# Patient Record
Sex: Male | Born: 1940 | Race: White | Marital: Single
Health system: Southern US, Community
[De-identification: ages and names within clinical notes are randomized; demographics above are authoritative.]

## PROBLEM LIST (undated history)

## (undated) DIAGNOSIS — I2089 Other forms of angina pectoris: Secondary | ICD-10-CM

## (undated) DIAGNOSIS — R0600 Dyspnea, unspecified: Secondary | ICD-10-CM

## (undated) DIAGNOSIS — I4891 Unspecified atrial fibrillation: Secondary | ICD-10-CM

## (undated) DIAGNOSIS — J189 Pneumonia, unspecified organism: Secondary | ICD-10-CM

## (undated) DIAGNOSIS — Z87442 Personal history of urinary calculi: Secondary | ICD-10-CM

## (undated) DIAGNOSIS — I208 Other forms of angina pectoris: Secondary | ICD-10-CM

## (undated) DIAGNOSIS — I2510 Atherosclerotic heart disease of native coronary artery without angina pectoris: Secondary | ICD-10-CM

## (undated) DIAGNOSIS — I219 Acute myocardial infarction, unspecified: Secondary | ICD-10-CM

## (undated) DIAGNOSIS — I499 Cardiac arrhythmia, unspecified: Secondary | ICD-10-CM

## (undated) DIAGNOSIS — N200 Calculus of kidney: Secondary | ICD-10-CM

## (undated) HISTORY — PX: CARDIAC CATHETERIZATION: SHX172

---

## 2018-07-18 ENCOUNTER — Encounter: Payer: Self-pay | Admitting: Neurology

## 2018-07-26 ENCOUNTER — Ambulatory Visit: Payer: Self-pay | Admitting: Neurology

## 2018-09-20 ENCOUNTER — Emergency Department (HOSPITAL_COMMUNITY): Payer: Medicare Other

## 2018-09-20 ENCOUNTER — Inpatient Hospital Stay (HOSPITAL_COMMUNITY)
Admission: EM | Admit: 2018-09-20 | Discharge: 2018-09-23 | DRG: 303 | Disposition: A | Discharge status: Home Health | Payer: Medicare Other | Attending: Internal Medicine | Admitting: Internal Medicine

## 2018-09-20 ENCOUNTER — Other Ambulatory Visit: Payer: Self-pay

## 2018-09-20 ENCOUNTER — Encounter (HOSPITAL_COMMUNITY): Payer: Self-pay

## 2018-09-20 DIAGNOSIS — Z955 Presence of coronary angioplasty implant and graft: Secondary | ICD-10-CM

## 2018-09-20 DIAGNOSIS — R079 Chest pain, unspecified: Secondary | ICD-10-CM | POA: Diagnosis not present

## 2018-09-20 DIAGNOSIS — Z7982 Long term (current) use of aspirin: Secondary | ICD-10-CM

## 2018-09-20 DIAGNOSIS — I25118 Atherosclerotic heart disease of native coronary artery with other forms of angina pectoris: Secondary | ICD-10-CM | POA: Diagnosis not present

## 2018-09-20 DIAGNOSIS — I252 Old myocardial infarction: Secondary | ICD-10-CM

## 2018-09-20 DIAGNOSIS — N139 Obstructive and reflux uropathy, unspecified: Secondary | ICD-10-CM

## 2018-09-20 DIAGNOSIS — N289 Disorder of kidney and ureter, unspecified: Secondary | ICD-10-CM

## 2018-09-20 DIAGNOSIS — N1330 Unspecified hydronephrosis: Secondary | ICD-10-CM | POA: Diagnosis present

## 2018-09-20 DIAGNOSIS — I4891 Unspecified atrial fibrillation: Secondary | ICD-10-CM

## 2018-09-20 DIAGNOSIS — I4510 Unspecified right bundle-branch block: Secondary | ICD-10-CM | POA: Diagnosis present

## 2018-09-20 DIAGNOSIS — Z87891 Personal history of nicotine dependence: Secondary | ICD-10-CM

## 2018-09-20 DIAGNOSIS — Z7902 Long term (current) use of antithrombotics/antiplatelets: Secondary | ICD-10-CM

## 2018-09-20 DIAGNOSIS — N201 Calculus of ureter: Secondary | ICD-10-CM | POA: Diagnosis present

## 2018-09-20 DIAGNOSIS — R109 Unspecified abdominal pain: Secondary | ICD-10-CM

## 2018-09-20 DIAGNOSIS — I482 Chronic atrial fibrillation, unspecified: Secondary | ICD-10-CM | POA: Diagnosis present

## 2018-09-20 DIAGNOSIS — N132 Hydronephrosis with renal and ureteral calculous obstruction: Secondary | ICD-10-CM | POA: Diagnosis present

## 2018-09-20 DIAGNOSIS — Z20828 Contact with and (suspected) exposure to other viral communicable diseases: Secondary | ICD-10-CM | POA: Diagnosis present

## 2018-09-20 DIAGNOSIS — I2510 Atherosclerotic heart disease of native coronary artery without angina pectoris: Secondary | ICD-10-CM | POA: Diagnosis present

## 2018-09-20 DIAGNOSIS — I208 Other forms of angina pectoris: Secondary | ICD-10-CM | POA: Diagnosis present

## 2018-09-20 DIAGNOSIS — I200 Unstable angina: Secondary | ICD-10-CM

## 2018-09-20 HISTORY — DX: Pneumonia, unspecified organism: J18.9

## 2018-09-20 HISTORY — DX: Acute myocardial infarction, unspecified: I21.9

## 2018-09-20 HISTORY — DX: Unspecified atrial fibrillation: I48.91

## 2018-09-20 HISTORY — DX: Atherosclerotic heart disease of native coronary artery without angina pectoris: I25.10

## 2018-09-20 HISTORY — DX: Other forms of angina pectoris: I20.89

## 2018-09-20 HISTORY — DX: Calculus of kidney: N20.0

## 2018-09-20 HISTORY — DX: Other forms of angina pectoris: I20.8

## 2018-09-20 LAB — CBC WITH DIFFERENTIAL/PLATELET
Abs Immature Granulocytes: 0.04 10*3/uL (ref 0.00–0.07)
Basophils Absolute: 0 10*3/uL (ref 0.0–0.1)
Basophils Relative: 0 %
Eosinophils Absolute: 0 10*3/uL (ref 0.0–0.5)
Eosinophils Relative: 0 %
HCT: 43.5 % (ref 39.0–52.0)
Hemoglobin: 13.8 g/dL (ref 13.0–17.0)
Immature Granulocytes: 1 %
Lymphocytes Relative: 8 %
Lymphs Abs: 0.6 10*3/uL — ABNORMAL LOW (ref 0.7–4.0)
MCH: 26.7 pg (ref 26.0–34.0)
MCHC: 31.7 g/dL (ref 30.0–36.0)
MCV: 84.3 fL (ref 80.0–100.0)
Monocytes Absolute: 0.8 10*3/uL (ref 0.1–1.0)
Monocytes Relative: 11 %
Neutro Abs: 5.7 10*3/uL (ref 1.7–7.7)
Neutrophils Relative %: 80 %
Platelets: 155 10*3/uL (ref 150–400)
RBC: 5.16 MIL/uL (ref 4.22–5.81)
RDW: 15.2 % (ref 11.5–15.5)
WBC: 7 10*3/uL (ref 4.0–10.5)
nRBC: 0 % (ref 0.0–0.2)

## 2018-09-20 LAB — COMPREHENSIVE METABOLIC PANEL
ALT: 17 U/L (ref 0–44)
AST: 33 U/L (ref 15–41)
Albumin: 3.4 g/dL — ABNORMAL LOW (ref 3.5–5.0)
Alkaline Phosphatase: 49 U/L (ref 38–126)
Anion gap: 10 (ref 5–15)
BUN: 20 mg/dL (ref 8–23)
CO2: 22 mmol/L (ref 22–32)
Calcium: 9.3 mg/dL (ref 8.9–10.3)
Chloride: 107 mmol/L (ref 98–111)
Creatinine, Ser: 1.7 mg/dL — ABNORMAL HIGH (ref 0.61–1.24)
GFR calc Af Amer: 44 mL/min — ABNORMAL LOW (ref >60)
GFR calc non Af Amer: 38 mL/min — ABNORMAL LOW (ref >60)
Glucose, Bld: 108 mg/dL — ABNORMAL HIGH (ref 70–99)
Potassium: 3.6 mmol/L (ref 3.5–5.1)
Sodium: 139 mmol/L (ref 135–145)
Total Bilirubin: 1.3 mg/dL — ABNORMAL HIGH (ref 0.3–1.2)
Total Protein: 6.4 g/dL — ABNORMAL LOW (ref 6.5–8.1)

## 2018-09-20 LAB — URINALYSIS, ROUTINE W REFLEX MICROSCOPIC
Bacteria, UA: NONE SEEN
Bilirubin Urine: NEGATIVE
Glucose, UA: NEGATIVE mg/dL
Ketones, ur: NEGATIVE mg/dL
Leukocytes,Ua: NEGATIVE
Nitrite: NEGATIVE
Protein, ur: 30 mg/dL — AB
Specific Gravity, Urine: 1.028 (ref 1.005–1.030)
pH: 5 (ref 5.0–8.0)

## 2018-09-20 LAB — TROPONIN I: Troponin I: <0.03 ng/mL (ref <0.03)

## 2018-09-20 LAB — GLUCOSE, CAPILLARY: Glucose-Capillary: 95 mg/dL (ref 70–99)

## 2018-09-20 LAB — SARS CORONAVIRUS 2 BY RT PCR (HOSPITAL ORDER, PERFORMED IN ~~LOC~~ HOSPITAL LAB): SARS Coronavirus 2: NEGATIVE

## 2018-09-20 MED ORDER — NITROGLYCERIN 0.4 MG SL SUBL: 0.4000 mg | SUBLINGUAL_TABLET | SUBLINGUAL | Status: DC | PRN

## 2018-09-20 MED ORDER — MORPHINE SULFATE (PF) 2 MG/ML IV SOLN
1.0000 mg | INTRAVENOUS | Status: DC | PRN
  Filled 2018-09-20: qty 1

## 2018-09-20 MED ORDER — ONDANSETRON HCL 4 MG/2ML IJ SOLN: 4.0000 mg | Freq: Four times a day (QID) | INTRAMUSCULAR | Status: DC | PRN

## 2018-09-20 MED ORDER — MORPHINE SULFATE (PF) 2 MG/ML IV SOLN
2.0000 mg | Freq: Once | INTRAVENOUS | Status: AC
  Administered 2018-09-20: 22:00:00 2 mg via INTRAVENOUS
  Filled 2018-09-20: qty 1

## 2018-09-20 MED ORDER — SODIUM CHLORIDE 0.9 % IV SOLN
INTRAVENOUS | Status: AC
  Administered 2018-09-21: via INTRAVENOUS

## 2018-09-20 MED ORDER — ACETAMINOPHEN 325 MG PO TABS: 650.0000 mg | ORAL_TABLET | ORAL | Status: DC | PRN

## 2018-09-20 MED ORDER — ASPIRIN EC 81 MG PO TBEC
81.0000 mg | DELAYED_RELEASE_TABLET | Freq: Every day | ORAL | Status: DC
  Administered 2018-09-21 – 2018-09-23 (×3): 81 mg via ORAL
  Filled 2018-09-20 (×3): qty 1

## 2018-09-20 NOTE — H&P (Signed)
History and Physical    Mathew Bennett RUE:[id number] DOB: [birth year] DOA: 09/20/2018  PCP: Larene Pickett, FNP   Patient coming from: Home   Chief Complaint: Right flank pain, chest pain   HPI: Mathew Bennett is a 78 y.o. male with medical history significant for coronary artery disease, nephrolithiasis, and atrial fibrillation, now presenting to the emergency department for evaluation of right flank pain and chest pain. Patient reports he been in his usual state of health until approximately 3 days ago when he developed pain in the right flank.  Pain is described as severe at times, radiating towards the right groin, and associated with nausea and some nonbloody vomiting.  Pain had been tolerable at home, but the patient then developed acute onset of central chest pain this evening that prompted his presentation to the ED.  He had been ambulating about his home when he developed acute onset of severe, pressure-like pain localized to the central chest and associated with diaphoresis and nausea.  Patient reports history of MI but does not remember what type of symptoms he was having at that time.  He denies any fevers, chills, hematuria, leg swelling or tenderness, or current shortness of breath.  He knows that he takes Plavix along with some other medications, but does not know the name of these or their indications.  ED Course: Upon arrival to the ED, patient is found to be afebrile, saturating mid 90s on room air, and with remaining vitals also normal.  EKG features atrial fibrillation with PVC and RBBB.  Chest x-ray is negative for acute cardiopulmonary disease.  CT renal stone protocol reveals acute obstructive uropathy on the right with 8 x 9 mm proximal right ureteral calculus and possible forniceal rupture.  Noncontrast head CT is negative for acute intracranial abnormality and cervical spine CT is negative for acute fracture.  CBC is unremarkable.  Chemistry panel is notable for creatinine 1.70.   Troponin is undetectable.  Urinalysis notable for small hemoglobin and 30 protein.  Patient reported a recent mechanical fall, denied any headache or neuro deficit, but underwent CT of the head and cervical spine without any acute intracranial abnormality or acute fracture.  Urology was contacted by the ED physician, reviewed the case, and advised that the patient could follow-up in the clinic if his pain could be controlled in the ED.  ED providers were concerned about the patient's chest pain and requested admission for ACS rule out.  Review of Systems:  All other systems reviewed and apart from HPI, are negative.  Past Medical History:  Diagnosis Date   Atrial fibrillation (HCC)    Heart attack (HCC)    Kidney stone    kidney stone   Pneumonia     Past Surgical History:  Procedure Laterality Date   CARDIAC CATHETERIZATION     with stent placement     reports that he has quit smoking. He has never used smokeless tobacco. No history on file for alcohol and drug.  No Known Allergies  History reviewed. No pertinent family history.  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   Prior to Admission medications   Not on File    Physical Exam: Vitals:   09/20/18 2128 09/20/18 2130 09/20/18 2145 09/20/18 2230  BP:  (!) 134/94 (!) 142/85 131/81  Pulse: 93 87 81 100  Resp: 20 19 (!) 22 17  Temp:      TempSrc:      SpO2: 97% 96% 97% 95%  Weight:  Height:        Constitutional: NAD, calm  Eyes: PERTLA, lids and conjunctivae normal ENMT: Mucous membranes are moist. Posterior pharynx clear of any exudate or lesions.   Neck: normal, supple, no masses, no thyromegaly Respiratory: no wheezing, no crackles. Normal respiratory effort. No accessory muscle use.  Cardiovascular: Rate ~80 and irregular. No extremity edema.   Abdomen: No distension, no tenderness, soft. Bowel sounds normal.  Musculoskeletal: no clubbing / cyanosis. No joint deformity upper and lower  extremities.  .  Skin: no significant rashes, lesions, ulcers. Warm, dry, well-perfused. Neurologic: CN 2-12 grossly intact. Sensation intact. Strength 5/5 in all 4 limbs.  Psychiatric: Alert and oriented to person, place, and situation. Very pleasant, cooperative.    Labs on Admission: I have personally reviewed following labs and imaging studies  CBC: Recent Labs  Lab 09/20/18 1715  WBC 7.0  NEUTROABS 5.7  HGB 13.8  HCT 43.5  MCV 84.3  PLT 155   Basic Metabolic Panel: Recent Labs  Lab 09/20/18 1715  NA 139  K 3.6  CL 107  CO2 22  GLUCOSE 108*  BUN 20  CREATININE 1.70*  CALCIUM 9.3   GFR: Estimated Creatinine Clearance: 45.1 mL/min (A) (by C-G formula based on SCr of 1.7 mg/dL (H)). Liver Function Tests: Recent Labs  Lab 09/20/18 1715  AST 33  ALT 17  ALKPHOS 49  BILITOT 1.3*  PROT 6.4*  ALBUMIN 3.4*   No results for input(s): LIPASE, AMYLASE in the last 168 hours. No results for input(s): AMMONIA in the last 168 hours. Coagulation Profile: No results for input(s): INR, PROTIME in the last 168 hours. Cardiac Enzymes: Recent Labs  Lab 09/20/18 2136  TROPONINI <0.03   BNP (last 3 results) No results for input(s): PROBNP in the last 8760 hours. HbA1C: No results for input(s): HGBA1C in the last 72 hours. CBG: No results for input(s): GLUCAP in the last 168 hours. Lipid Profile: No results for input(s): CHOL, HDL, LDLCALC, TRIG, CHOLHDL, LDLDIRECT in the last 72 hours. Thyroid Function Tests: No results for input(s): TSH, T4TOTAL, FREET4, T3FREE, THYROIDAB in the last 72 hours. Anemia Panel: No results for input(s): VITAMINB12, FOLATE, FERRITIN, TIBC, IRON, RETICCTPCT in the last 72 hours. Urine analysis:    Component Value Date/Time   COLORURINE YELLOW 09/20/2018 1940   APPEARANCEUR CLEAR 09/20/2018 1940   LABSPEC 1.028 09/20/2018 1940   PHURINE 5.0 09/20/2018 1940   GLUCOSEU NEGATIVE 09/20/2018 1940   HGBUR SMALL (A) 09/20/2018 1940    BILIRUBINUR NEGATIVE 09/20/2018 1940   KETONESUR NEGATIVE 09/20/2018 1940   PROTEINUR 30 (A) 09/20/2018 1940   NITRITE NEGATIVE 09/20/2018 1940   LEUKOCYTESUR NEGATIVE 09/20/2018 1940   Sepsis Labs: (procalcitonin:4,lacticidven:4) )No results found for this or any previous visit (from the past 240 hour(s)).   Radiological Exams on Admission: Ct Head Wo Contrast  Result Date: 09/20/2018 CLINICAL DATA:  Acute pain due to trauma EXAM: CT HEAD WITHOUT CONTRAST CT CERVICAL SPINE WITHOUT CONTRAST TECHNIQUE: Multidetector CT imaging of the head and cervical spine was performed following the standard protocol without intravenous contrast. Multiplanar CT image reconstructions of the cervical spine were also generated. COMPARISON:  None. FINDINGS: CT HEAD FINDINGS Brain: No evidence of acute infarction, hemorrhage, hydrocephalus, extra-axial collection or mass lesion/mass effect. 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 There is significant volume loss, somewhat greater than expected for the patient's age. Vascular: No hyperdense vessel or unexpected calcification. Skull: Normal. Negative for fracture or focal lesion. Sinuses/Orbits: No acute finding. Other:  None. CT CERVICAL SPINE FINDINGS Alignment: Normal. Skull base and vertebrae: No acute fracture. No primary bone lesion or focal pathologic process. Soft tissues and spinal canal: No prevertebral fluid or swelling. No visible canal hematoma. Disc levels: There is mild to moderate multilevel disc height loss throughout the cervical spine. Upper chest: Negative. Other: None IMPRESSION: 1. No acute intracranial abnormality detected. 2. No acute cervical spine fracture. 3. 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 4. Mild multilevel degenerative changes are seen involving the cervical spine. Electronically Signed   By: Katherine Mantle M.D.   On: 09/20/2018 22:06   Ct Cervical Spine Wo Contrast  Result Date: 09/20/2018 CLINICAL  DATA:  Acute pain due to trauma EXAM: CT HEAD WITHOUT CONTRAST CT CERVICAL SPINE WITHOUT CONTRAST TECHNIQUE: Multidetector CT imaging of the head and cervical spine was performed following the standard protocol without intravenous contrast. Multiplanar CT image reconstructions of the cervical spine were also generated. COMPARISON:  None. FINDINGS: CT HEAD FINDINGS Brain: No evidence of acute infarction, hemorrhage, hydrocephalus, extra-axial collection or mass lesion/mass effect. 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 There is significant volume loss, somewhat greater than expected for the patient's age. Vascular: No hyperdense vessel or unexpected calcification. Skull: Normal. Negative for fracture or focal lesion. Sinuses/Orbits: No acute finding. Other: None. CT CERVICAL SPINE FINDINGS Alignment: Normal. Skull base and vertebrae: No acute fracture. No primary bone lesion or focal pathologic process. Soft tissues and spinal canal: No prevertebral fluid or swelling. No visible canal hematoma. Disc levels: There is mild to moderate multilevel disc height loss throughout the cervical spine. Upper chest: Negative. Other: None IMPRESSION: 1. No acute intracranial abnormality detected. 2. No acute cervical spine fracture. 3. 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 4. Mild multilevel degenerative changes are seen involving the cervical spine. Electronically Signed   By: Katherine Mantle M.D.   On: 09/20/2018 22:06   Dg Chest Portable 1 View  Result Date: 09/20/2018 CLINICAL DATA:  Chest pain EXAM: PORTABLE CHEST 1 VIEW COMPARISON:  CT 09/20/2018 FINDINGS: Mild cardiomegaly. No focal consolidation or effusion. Aortic atherosclerosis. No pneumothorax. IMPRESSION: No active disease.  Mild cardiomegaly. Electronically Signed   By: Jasmine Pang M.D.   On: 09/20/2018 22:15   Ct Renal Stone Study  Result Date: 09/20/2018 CLINICAL DATA:  78 year old male with right flank pain since yesterday with  vomiting. EXAM: CT ABDOMEN AND PELVIS WITHOUT CONTRAST TECHNIQUE: Multidetector CT imaging of the abdomen and pelvis was performed following the standard protocol without IV contrast. COMPARISON:  None available. FINDINGS: Lower chest: Noncalcified posterior pleural plaque in the right lung, posterolateral pleural plaque in the left lower lobe (series 7, image 4). Associated bilateral lung base increased reticular opacity, possibly developing fibrosis. No definite pleural fluid. No pericardial effusion. Calcified coronary artery atherosclerosis and/or stents. Hepatobiliary: Negative noncontrast liver and gallbladder. Pancreas: Negative aside from some atrophy. Spleen: Negative. Adrenals/Urinary Tract: Normal adrenal glands. Negative noncontrast left kidney and left ureter. Unremarkable urinary bladder. The distal right ureter is normal. There is mild-to-moderate right pararenal stranding and moderate right periureteral stranding which tracks caudally in the right retroperitoneal space (series 3, image 56). There is a large oval 8 x 9 millimeter calculus in the proximal right ureter about 15 millimeters distal to the right ureteropelvic junction with hydronephrosis. There is an additional 6 millimeter right intrarenal calculus near the midpole. Stomach/Bowel: Decompressed and negative descending, rectosigmoid colon. Negative transverse colon. There is mild stranding in the right gutter but this appears related to the right  pararenal space. No convincing large bowel inflammation. Negative terminal ileum. Diminutive or absent appendix. No dilated small bowel. Small fat containing hiatal hernia. Negative stomach. No free air. Vascular/Lymphatic: Vascular patency is not evaluated in the absence of IV contrast. Extensive Aortoiliac calcified atherosclerosis. 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 No lymphadenopathy. Reproductive: Small fat containing right  inguinal hernia. Other: Trace pelvic free fluid ventral to the rectum on series 3, image 79. Simple fluid density. Musculoskeletal: Osteopenia. No acute osseous abnormality identified. IMPRESSION: 1. Acute obstructive uropathy on the right with a large 8 x 9 mm proximal right ureteral calculus about 15 mm distal to the right UPJ. Possible forniceal rupture. 2. 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 Aortic Atherosclerosis (ICD10-I70.0). 3. 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 4. Small fat containing hiatal and right inguinal hernias. Electronically Signed   By: Odessa FlemingH  Hall M.D.   On: 09/20/2018 20:27    EKG: Independently reviewed. Atrial fibrillation, PVC, RBBB.   Assessment/Plan   1. Chest pain; CAD  - Patient presents with 3 days of right flank pain and then acute-onset of chest pain with nausea and diaphoresis just prior to arrival  - Initial troponin is undetectable, CXR without acute findings, and EKG with a fib, PVC, and RBBB  - Patient reports hx of CAD with stents  - He took full-dose ASA prior to arrival  - Continue cardiac monitoring, obtain serial troponin measurements, repeat EKG, continue antiplatelet     2. Right ureteral obstructing stone  - Presents with 3 days of right flank pain, reports history of nephrolithiasis, and is found to have obstructing right proximal ureteral stone measuring 8 x 9 mm with possible forniceal rupture  - There is no evidence for infection and pain is well-controlled in ED  - Urology was contacted by ED physician and advised that no emergent procedure needed and recommended that patient be seen in urology clinic tomorrow if pain can be controlled - Continue pain-control, supportive care   3. Renal insufficiency  - SCr is 1.70 on admission with no prior labs for comparison  - Patient unsure if he has CKD history  - Right-sided ureteral obstruction  noted on CT as above  - Unclear chronicity, possibly some acute prerenal injury component from N/V - Check urine chemistries, renally-dose medications, and repeat chem panel in am   4. Atrial fibrillation  - In rate-controlled a fib on admission  - CHADS-VASc is at least 73 (age x2, CAD)  - Patient does not know what his medications are other than Plavix, [facility]     PPE: Mask, face shield  DVT prophylaxis: SCD's pending pharmacy med-rec  Code Status: Full  Family Communication: Discussed with patient  Consults called: ED physician discussed with urology  Admission status: Observation     Briscoe Deutscherimothy S Dalbert Stillings, MD [facility] Pager [phone]  If 7PM-7AM, please contact night-coverage [email or web address] Password [email or web address]  09/20/2018, 10:57 PM

## 2018-09-20 NOTE — ED Notes (Signed)
Patient transported to CT 

## 2018-09-20 NOTE — ED Notes (Signed)
ED TO INPATIENT HANDOFF REPORT  ED Nurse Name and Phone #: Aundra Millet [phone]  S Name/Age/Gender Mathew Bennett 78 y.o. male Room/Bed: 042C/042C  Code Status   Code Status: Full Code  Home/SNF/Other Home Patient oriented to: self, place, time and situation Is this baseline? Yes   Triage Complete: Triage complete  Chief Complaint CP; epigastric pain  Triage Note Pt arrives from home via EMS for right side flank pain starting yesterday with emesis. Pt stood up to go to bathroom and had sharp non-radiating CP, took 324 ASA and denies current chest or flank pain. Pt reports hx of kidney stones. Pt alert and oriented. Pt in a fib with HR in 80's-90's hx of a fib per EMS.   Allergies No Known Allergies  Level of Care/Admitting Diagnosis ED Disposition    ED Disposition Condition Comment   Admit  Hospital Area: [facility] [[id number]]  Level of Care: Telemetry Cardiac [103]  I expect the patient will be discharged within 24 hours: No (not a candidate for 5C-Observation unit)  Covid Evaluation: Screening Protocol (No Symptoms)  Diagnosis: Chest pain [498264]  Admitting Physician: Briscoe Deutscher [[id number]]  Attending Physician: Briscoe Deutscher [[id number]]  PT Class (Do Not Modify): Observation [104]  PT Acc Code (Do Not Modify): Observation [10022]       B Medical/Surgery History Past Medical History:  Diagnosis Date  . Atrial fibrillation (HCC)   . Heart attack (HCC)   . Kidney stone    kidney stone  . Pneumonia    Past Surgical History:  Procedure Laterality Date  . CARDIAC CATHETERIZATION     with stent placement     A IV Location/Drains/Wounds Patient Lines/Drains/Airways Status   Active Line/Drains/Airways    Name:   Placement date:   Placement time:   Site:   Days:   Peripheral IV 09/20/18 Left Antecubital   09/20/18    1644    Antecubital   less than 1          Intake/Output Last 24 hours No intake or output data in the 24 hours ending  09/20/18 2302  Labs/Imaging Results for orders placed or performed during the hospital encounter of 09/20/18 (from the past 48 hour(s))  Comprehensive metabolic panel     Status: Abnormal   Collection Time: 09/20/18  5:15 PM  Result Value Ref Range   Sodium 139 135 - 145 mmol/L   Potassium 3.6 3.5 - 5.1 mmol/L   Chloride 107 98 - 111 mmol/L   CO2 22 22 - 32 mmol/L   Glucose, Bld 108 (H) 70 - 99 mg/dL   BUN 20 8 - 23 mg/dL   Creatinine, Ser 1.10 (H) 0.61 - 1.24 mg/dL   Calcium 9.3 8.9 - 31.5 mg/dL   Total Protein 6.4 (L) 6.5 - 8.1 g/dL   Albumin 3.4 (L) 3.5 - 5.0 g/dL   AST 33 15 - 41 U/L   ALT 17 0 - 44 U/L   Alkaline Phosphatase 49 38 - 126 U/L   Total Bilirubin 1.3 (H) 0.3 - 1.2 mg/dL   GFR calc non Af Amer 38 (L) >60 mL/min   GFR calc Af Amer 44 (L) >60 mL/min   Anion gap 10 5 - 15    Comment: Performed at [facility], [address], [place], [place] [address]  CBC with Differential     Status: Abnormal   Collection Time: 09/20/18  5:15 PM  Result Value Ref Range   WBC  7.0 4.0 - 10.5 K/uL   RBC 5.16 4.22 - 5.81 MIL/uL   Hemoglobin 13.8 13.0 - 17.0 g/dL   HCT 16.143.5 09.639.0 - 04.552.0 %   MCV 84.3 80.0 - 100.0 fL   MCH 26.7 26.0 - 34.0 pg   MCHC 31.7 30.0 - 36.0 g/dL   RDW 40.915.2 81.111.5 - 91.415.5 %   Platelets 155 150 - 400 K/uL   nRBC 0.0 0.0 - 0.2 %   Neutrophils Relative % 80 %   Neutro Abs 5.7 1.7 - 7.7 K/uL   Lymphocytes Relative 8 %   Lymphs Abs 0.6 (L) 0.7 - 4.0 K/uL   Monocytes Relative 11 %   Monocytes Absolute 0.8 0.1 - 1.0 K/uL   Eosinophils Relative 0 %   Eosinophils Absolute 0.0 0.0 - 0.5 K/uL   Basophils Relative 0 %   Basophils Absolute 0.0 0.0 - 0.1 K/uL   Immature Granulocytes 1 %   Abs Immature Granulocytes 0.04 0.00 - 0.07 K/uL    Comment: Performed at [facility], [address] [place], [place] [address]  Urinalysis, Routine w reflex microscopic     Status: Abnormal   Collection Time: 09/20/18  7:40 PM  Result Value Ref Range   Color, Urine  YELLOW YELLOW   APPearance CLEAR CLEAR   Specific Gravity, Urine 1.028 1.005 - 1.030   pH 5.0 5.0 - 8.0   Glucose, UA NEGATIVE NEGATIVE mg/dL   Hgb urine dipstick SMALL (A) NEGATIVE   Bilirubin Urine NEGATIVE NEGATIVE   Ketones, ur NEGATIVE NEGATIVE mg/dL   Protein, ur 30 (A) NEGATIVE mg/dL   Nitrite NEGATIVE NEGATIVE   Leukocytes,Ua NEGATIVE NEGATIVE   RBC / HPF 0-5 0 - 5 RBC/hpf   WBC, UA 0-5 0 - 5 WBC/hpf   Bacteria, UA NONE SEEN NONE SEEN   Mucus PRESENT     Comment: Performed at [facility], [address] [place], [place] [address]  Troponin I - ONCE - STAT     Status: None   Collection Time: 09/20/18  9:36 PM  Result Value Ref Range   Troponin I <0.03 <0.03 ng/mL    Comment: Performed at [facility], [address] [place], [place] [address]   Ct Head Wo Contrast  Result Date: 09/20/2018 CLINICAL DATA:  Acute pain due to trauma EXAM: CT HEAD WITHOUT CONTRAST CT CERVICAL SPINE WITHOUT CONTRAST TECHNIQUE: Multidetector CT imaging of the head and cervical spine was performed following the standard protocol without intravenous contrast. Multiplanar CT image reconstructions of the cervical spine were also generated. COMPARISON:  None. FINDINGS: CT HEAD FINDINGS Brain: No evidence of acute infarction, hemorrhage, hydrocephalus, extra-axial collection or mass lesion/mass effect. 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 There is significant volume loss, somewhat greater than expected for the patient's age. Vascular: No hyperdense vessel or unexpected calcification. Skull: Normal. Negative for fracture or focal lesion. Sinuses/Orbits: No acute finding. Other: None. CT CERVICAL SPINE FINDINGS Alignment: Normal. Skull base and vertebrae: No acute fracture. No primary bone lesion or focal pathologic process. Soft tissues and spinal canal: No prevertebral fluid or swelling. No visible canal hematoma. Disc levels: There is mild to moderate multilevel disc height loss  throughout the cervical spine. Upper chest: Negative. Other: None IMPRESSION: 1. No acute intracranial abnormality detected. 2. No acute cervical spine fracture. 3. 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 4. Mild multilevel degenerative changes are seen involving the cervical spine. Electronically Signed   By: Beryle Quanthristopher  Green M.D.  On: 09/20/2018 22:06   Ct Cervical Spine Wo Contrast  Result Date: 09/20/2018 CLINICAL DATA:  Acute pain due to trauma EXAM: CT HEAD WITHOUT CONTRAST CT CERVICAL SPINE WITHOUT CONTRAST TECHNIQUE: Multidetector CT imaging of the head and cervical spine was performed following the standard protocol without intravenous contrast. Multiplanar CT image reconstructions of the cervical spine were also generated. COMPARISON:  None. FINDINGS: CT HEAD FINDINGS Brain: No evidence of acute infarction, hemorrhage, hydrocephalus, extra-axial collection or mass lesion/mass effect. 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 There is significant volume loss, somewhat greater than expected for the patient's age. Vascular: No hyperdense vessel or unexpected calcification. Skull: Normal. Negative for fracture or focal lesion. Sinuses/Orbits: No acute finding. Other: None. CT CERVICAL SPINE FINDINGS Alignment: Normal. Skull base and vertebrae: No acute fracture. No primary bone lesion or focal pathologic process. Soft tissues and spinal canal: No prevertebral fluid or swelling. No visible canal hematoma. Disc levels: There is mild to moderate multilevel disc height loss throughout the cervical spine. Upper chest: Negative. Other: None IMPRESSION: 1. No acute intracranial abnormality detected. 2. No acute cervical spine fracture. 3. 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 4. Mild multilevel degenerative changes are seen involving the cervical spine. Electronically Signed   By: Katherine Mantle M.D.   On: 09/20/2018 22:06   Dg Chest Portable 1 View  Result Date:  09/20/2018 CLINICAL DATA:  Chest pain EXAM: PORTABLE CHEST 1 VIEW COMPARISON:  CT 09/20/2018 FINDINGS: Mild cardiomegaly. No focal consolidation or effusion. Aortic atherosclerosis. No pneumothorax. IMPRESSION: No active disease.  Mild cardiomegaly. Electronically Signed   By: Jasmine Pang M.D.   On: 09/20/2018 22:15   Ct Renal Stone Study  Result Date: 09/20/2018 CLINICAL DATA:  78 year old male with right flank pain since yesterday with vomiting. EXAM: CT ABDOMEN AND PELVIS WITHOUT CONTRAST TECHNIQUE: Multidetector CT imaging of the abdomen and pelvis was performed following the standard protocol without IV contrast. COMPARISON:  None available. FINDINGS: Lower chest: Noncalcified posterior pleural plaque in the right lung, posterolateral pleural plaque in the left lower lobe (series 7, image 4). Associated bilateral lung base increased reticular opacity, possibly developing fibrosis. No definite pleural fluid. No pericardial effusion. Calcified coronary artery atherosclerosis and/or stents. Hepatobiliary: Negative noncontrast liver and gallbladder. Pancreas: Negative aside from some atrophy. Spleen: Negative. Adrenals/Urinary Tract: Normal adrenal glands. Negative noncontrast left kidney and left ureter. Unremarkable urinary bladder. The distal right ureter is normal. There is mild-to-moderate right pararenal stranding and moderate right periureteral stranding which tracks caudally in the right retroperitoneal space (series 3, image 56). There is a large oval 8 x 9 millimeter calculus in the proximal right ureter about 15 millimeters distal to the right ureteropelvic junction with hydronephrosis. There is an additional 6 millimeter right intrarenal calculus near the midpole. Stomach/Bowel: Decompressed and negative descending, rectosigmoid colon. Negative transverse colon. There is mild stranding in the right gutter but this appears related to the right pararenal space. No convincing large bowel  inflammation. Negative terminal ileum. Diminutive or absent appendix. No dilated small bowel. Small fat containing hiatal hernia. Negative stomach. No free air. Vascular/Lymphatic: Vascular patency is not evaluated in the absence of IV contrast. Extensive Aortoiliac calcified atherosclerosis. 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 No lymphadenopathy. Reproductive: Small fat containing right inguinal hernia. Other: Trace pelvic free fluid ventral to the rectum on series 3, image 79. Simple fluid density. Musculoskeletal: Osteopenia. No acute osseous abnormality identified. IMPRESSION: 1. Acute obstructive uropathy  on the right with a large 8 x 9 mm proximal right ureteral calculus about 15 mm distal to the right UPJ. Possible forniceal rupture. 2. 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 Aortic Atherosclerosis (ICD10-I70.0). 3. 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 4. Small fat containing hiatal and right inguinal hernias. Electronically Signed   By: Odessa Fleming M.D.   On: 09/20/2018 20:27    Pending Labs Unresulted Labs (From admission, onward)    Start     Ordered   09/21/18 0900  Troponin I - Once  Once,   R     09/20/18 2257   09/21/18 0500  Basic metabolic panel  Tomorrow morning,   R     09/20/18 2257   09/21/18 0500  CBC  Tomorrow morning,   R     09/20/18 2257   09/21/18 0300  Troponin I - Once-Timed  Once-Timed,   R     09/20/18 2257   09/20/18 2256  Sodium, urine, random  Add-on,   R     09/20/18 2257   09/20/18 2256  Creatinine, urine, random  Add-on,   R     09/20/18 2257   09/20/18 2256  Urea nitrogen, urine  Add-on,   R     09/20/18 2257   09/20/18 2116  SARS Coronavirus 2 (CEPHEID - Performed in [facility] hospital lab), Hosp Order  (Asymptomatic Patients Labs)  Once,   R    Question:  Rule Out  Answer:  Yes   09/20/18  2115   09/20/18 1701  Urine culture  ONCE - STAT,   STAT    Question:  Patient immune status  Answer:  Normal   09/20/18 1701          Vitals/Pain Today's Vitals   09/20/18 2130 09/20/18 2145 09/20/18 2217 09/20/18 2230  BP: (!) 134/94 (!) 142/85  131/81  Pulse: 87 81  100  Resp: 19 (!) 22  17  Temp:      TempSrc:      SpO2: 96% 97%  95%  Weight:      Height:      PainSc:   5      Isolation Precautions No active isolations  Medications Medications  aspirin EC tablet 81 mg (has no administration in time range)  nitroGLYCERIN (NITROSTAT) SL tablet 0.4 mg (has no administration in time range)  acetaminophen (TYLENOL) tablet 650 mg (has no administration in time range)  ondansetron (ZOFRAN) injection 4 mg (has no administration in time range)  morphine 2 MG/ML injection 1-3 mg (has no administration in time range)  0.9 %  sodium chloride infusion (has no administration in time range)  morphine 2 MG/ML injection 2 mg (2 mg Intravenous Given 09/20/18 2218)    Mobility walks     Focused Assessments Renal Assessment Handoff:  Hemodialysis Schedule: N/A Last Hemodialysis date and time: N/A   Restricted appendage: N/A     R Recommendations: See Admitting Provider Note  Report given to:   Additional Notes: Pt admitted for kidney stone. Pt had right flank pain yesteday. Pt had episode of CP today, denies current CP.

## 2018-09-20 NOTE — ED Notes (Signed)
Pt returned from CT °

## 2018-09-20 NOTE — ED Triage Notes (Addendum)
Pt arrives from home via EMS for right side flank pain starting yesterday with emesis. Pt stood up to go to bathroom and had sharp non-radiating CP, took 324 ASA and denies current chest or flank pain. Pt reports hx of kidney stones. Pt alert and oriented. Pt in a fib with HR in 80's-90's hx of a fib per EMS.

## 2018-09-20 NOTE — ED Provider Notes (Signed)
Care handoff received from [facility], PA-C at shift change.  She received this patient in handoff from Dr. Jacqulyn Bath at previous shift change.  In short patient with history of CAD, MI, A. fib presents the emergency department with right flank pain. Physical Exam  BP 133/85 (BP Location: Right Arm)   Pulse 92   Temp 98.2 F (36.8 C) (Oral)   Resp 18   Ht 6\' 5"  (1.956 m)   Wt 100.2 kg   SpO2 99%   BMI 26.21 kg/m   Physical Exam Constitutional:      General: He is not in acute distress.    Appearance: Normal appearance. He is well-developed. He is obese. He is not ill-appearing or diaphoretic.  HENT:     Head: Normocephalic and atraumatic.     Right Ear: External ear normal.     Left Ear: External ear normal.     Nose: Nose normal.     Mouth/Throat:     Mouth: Mucous membranes are moist.     Pharynx: Oropharynx is clear.  Eyes:     General: Vision grossly intact. Gaze aligned appropriately.     Pupils: Pupils are equal, round, and reactive to light.  Neck:     Musculoskeletal: Normal range of motion.     Trachea: Trachea and phonation normal. No tracheal deviation.  Cardiovascular:     Rate and Rhythm: Normal rate and regular rhythm.     Pulses: Normal pulses.     Heart sounds: Normal heart sounds.  Pulmonary:     Effort: Pulmonary effort is normal. No respiratory distress.     Breath sounds: Normal breath sounds. No rhonchi.  Abdominal:     General: There is no distension.     Palpations: Abdomen is soft.     Tenderness: There is no abdominal tenderness. There is right CVA tenderness. There is no left CVA tenderness, guarding or rebound.  Musculoskeletal: Normal range of motion.     Comments: No midline C/T/L spinal tenderness to palpation, no paraspinal muscle tenderness, no deformity, crepitus, or step-off noted. No sign of injury to the neck or back. --------- Hips stable to compression bilaterally no pain with knee-to-chest bilaterally. - All major joints brought  through range of motion without pain or crepitus.  Skin:    General: Skin is warm and dry.  Neurological:     Mental Status: He is alert.     GCS: GCS eye subscore is 4. GCS verbal subscore is 5. GCS motor subscore is 6.     Comments: Speech is clear and goal oriented, follows commands Major Cranial nerves without deficit, no facial droop Moves extremities without ataxia, coordination intact  Psychiatric:        Behavior: Behavior normal.     ED Course/Procedures   Clinical Course as of Sep 19 2345  Tue Sep 20, 2018  2119 Discussed case with urology, Dr. Berneice Heinrich who has reviewed imaging and labs, advises that patient's pain is controlled here in the emergency department he may follow-up outpatient with his office tomorrow morning.   [BM]  2251 Discussed case w/ Dr. Antionette Char.   [BM]    Clinical Course User Index [BM] Harlene Salts A, PA-C    Procedures  MDM  EKG: Atrial fibrillation Ventricular premature complex Right bundle branch block No STEMI. No old tracing for comparison Confirmed by Alona Bene [phone]) on 09/20/2018 5:04:18 PM  CBC nonacute CMP with creatinine of 1.7 Urinalysis with 30 protein CT renal stone study:  IMPRESSION:  1. Acute obstructive uropathy on the right with a large 8 x 9 mm  proximal right ureteral calculus about 15 mm distal to the right  UPJ. Possible forniceal rupture.  2. 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 Aortic  Atherosclerosis (ICD10-I70.0).  3. Bilateral lung base pleural plaques and possible developing  fibrosis.  4. Small fat containing hiatal and right inguinal hernias.  ------------------------ Care handoff was given to me at this point in the patient's work-up, I discussed CT findings with Dr. Berneice Heinrich urology who has reviewed patient's imaging and labs, advises that once patient's pain is controlled here in the emergency department he may follow-up  outpatient with his office tomorrow morning.  If pain is unable to be controlled then reconsult. - Upon my initial evaluation of the patient reports additional complaints below:  Reports right flank pain x3 days intermittent sharp/throbbing moderate intensity without clear aggravating or alleviating factors.  No change x3 days.  History of nephrolithiasis.  Denies dysuria/hematuria, fever/chills.  One episode of nausea without vomiting.  Patient reports chest pain reports that he woke this morning and was ambulating around his house and had a sudden onset central chest pressure/squeezing pain that lasted approximately 15 minutes with diaphoresis and nausea gradually subsided with rest and has not returned since that time.  Patient reports that he took 4 aspirin prior to arrival.  Denies similar in the past does have history of CAD and MI.  Currently on Plavix reports that he takes daily denies missed doses.  Patient chest pain-free at this time.  Patient gets most of his medical care in [place], unable to access medical records patient is unaware of other medications he is on states that his wife handles his medications.  Additionally patient reports fall states that 2 days ago he was walking out of his doorway when he missed the handle and fell forward questionable loss of consciousness was able to move himself from the ground and return inside without assistance or difficulty no pain following the fall he does report that he is on Plavix, no headache vision changes neck pain back pain or injury. -------------------------- ED work-up was expanded.  CT head/cervical spine:  IMPRESSION:  1. No acute intracranial abnormality detected.  2. No acute cervical spine fracture.  3. 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  4. Mild multilevel degenerative changes are seen involving the  cervical spine.   DG chest:  IMPRESSION:  No active disease. Mild cardiomegaly.   Troponin  negative ---------------------------- As patient has history of CAD, MI and with concerning chest pain this morning patient will need further admission for cardiac work-up.  Low suspicion for PE at this time as patient is compliant with his Plavix, chest pain-free and with no history of shortness of breath, no tachycardia or hypoxia on room air.  Patient is comfortable appearing, chest pain-free, no abdominal pain, distal pulses equal and intact in all 4 extremities do not suspect dissection.  Case was discussed with Dr. Donnald Garre who agrees with need for admission for cardiac work-up at this time.  Pain controlled in ER patient reassessed resting comfortably no acute distress states resolution of his right flank pain.  Vital signs stable.  No other complaints at this time.  Discussed case with Dr. Antionette Char who will be seeing patient for admission. - Patient has been admitted to hospital service for further management.    Note: Portions of this report may  have been transcribed using voice recognition software. Every effort was made to ensure accuracy; however, inadvertent computerized transcription errors may still be present.   Elizabeth PalauMorelli, Deaunna Olarte A, PA-C 09/20/18 Arletha [id number]    Arby BarrettePfeiffer, Marcy, MD 09/28/18 1002

## 2018-09-20 NOTE — ED Provider Notes (Signed)
Emergency Department Provider Note   I have reviewed the triage vital signs and the nursing notes.   HISTORY  Chief Complaint Flank Pain   HPI Mathew Bennett is a 78 y.o. male with PMH of A-fib and CAD resents to the emergency department for evaluation of right flank pain. Patient states he has h/o kidney stone and this feels similar. Denies fever, chills. No dysuria, hesitancy, or urgency. He had severe pain today which prompted EMS call and ED evaluation but states that pain has mostly resolved. He had nausea/vomiting with severe pain earlier. No CP or SOB. No modifying factors. Pain is right flank and radiating anteriorly.   Past Medical History:  Diagnosis Date  . Atrial fibrillation (HCC)   . Heart attack (HCC)   . Kidney stone    kidney stone  . Pneumonia     Patient Active Problem List   Diagnosis Date Noted  . Ureteral calculus 09/21/2018  . Hydronephrosis 09/21/2018  . Chest pain 09/20/2018  . Renal insufficiency 09/20/2018  . Atrial fibrillation (HCC) 09/20/2018  . Acute unilateral obstructive uropathy 09/20/2018    Past Surgical History:  Procedure Laterality Date  . CARDIAC CATHETERIZATION     with stent placement    Allergies Patient has no known allergies.  History reviewed. No pertinent family history.  Social History Social History   Tobacco Use  . Smoking status: Former Games developermoker  . Smokeless tobacco: Never Used  Substance Use Topics  . Alcohol use: Not on file  . Drug use: Not on file    Review of Systems  Constitutional: No fever/chills Eyes: No visual changes. ENT: No sore throat. Cardiovascular: Denies chest pain. Respiratory: Denies shortness of breath. Gastrointestinal: Positive right flank/abdominal pain.  Positive nausea and vomiting.  No diarrhea.  No constipation. Genitourinary: Negative for dysuria. Musculoskeletal: Negative for back pain. Skin: Negative for rash. Neurological: Negative for headaches, focal weakness or  numbness.  10-point ROS otherwise negative.  ____________________________________________   PHYSICAL EXAM:  VITAL SIGNS: ED Triage Vitals  Enc Vitals Group     BP 09/20/18 1646 124/63     Pulse Rate 09/20/18 1646 93     Resp 09/20/18 1646 16     Temp 09/20/18 1646 98.3 F (36.8 C)     Temp Source 09/20/18 1646 Oral     SpO2 09/20/18 1646 97 %     Weight 09/20/18 1648 221 lb (100.2 kg)     Height 09/20/18 1648 6\' 5"  (1.956 m)    Constitutional: Alert and oriented. Well appearing and in no acute distress. Eyes: Conjunctivae are normal.  Head: Atraumatic. Nose: No congestion/rhinnorhea. Mouth/Throat: Mucous membranes are moist.  Neck: No stridor.  Cardiovascular: Normal rate, regular rhythm. Good peripheral circulation. Grossly normal heart sounds.   Respiratory: Normal respiratory effort.  No retractions. Lungs CTAB. Gastrointestinal: Soft and nontender. No distention.  Musculoskeletal: No lower extremity tenderness nor edema. No gross deformities of extremities. Neurologic:  Normal speech and language. No gross focal neurologic deficits are appreciated.  Skin:  Skin is warm, dry and intact. No rash noted.  ____________________________________________   LABS (all labs ordered are listed, but only abnormal results are displayed)  Labs Reviewed  URINALYSIS, ROUTINE W REFLEX MICROSCOPIC - Abnormal; Notable for the following components:      Result Value   Hgb urine dipstick SMALL (*)    Protein, ur 30 (*)    All other components within normal limits  COMPREHENSIVE METABOLIC PANEL - Abnormal; Notable for the  following components:   Glucose, Bld 108 (*)    Creatinine, Ser 1.70 (*)    Total Protein 6.4 (*)    Albumin 3.4 (*)    Total Bilirubin 1.3 (*)    GFR calc non Af Amer 38 (*)    GFR calc Af Amer 44 (*)    All other components within normal limits  CBC WITH DIFFERENTIAL/PLATELET - Abnormal; Notable for the following components:   Lymphs Abs 0.6 (*)    All other  components within normal limits  BASIC METABOLIC PANEL - Abnormal; Notable for the following components:   CO2 20 (*)    Creatinine, Ser 1.70 (*)    GFR calc non Af Amer 38 (*)    GFR calc Af Amer 44 (*)    All other components within normal limits  CBC - Abnormal; Notable for the following components:   Platelets 148 (*)    All other components within normal limits  SARS CORONAVIRUS 2 (HOSPITAL ORDER, PERFORMED IN [facility])  URINE CULTURE  TROPONIN I  TROPONIN I  TROPONIN I  GLUCOSE, CAPILLARY  CREATININE, URINE, RANDOM  SODIUM, URINE, RANDOM  UREA NITROGEN, URINE  BASIC METABOLIC PANEL  CBC   ____________________________________________  EKG   EKG Interpretation  Date/Time:  Tuesday Sep 20 2018 16:45:57 EDT Ventricular Rate:  80 PR Interval:    QRS Duration: 140 QT Interval:  408 QTC Calculation: 471 R Axis:   73 Text Interpretation:  Atrial fibrillation Ventricular premature complex Right bundle branch block No STEMI. No old tracing for comparison  Confirmed by Alona Bene [phone]) on 09/20/2018 5:04:18 PM       ____________________________________________  RADIOLOGY  Dg Abd 1 View  Result Date: 09/21/2018 CLINICAL DATA:  Abdominal pain. EXAM: ABDOMEN - 1 VIEW COMPARISON:  CT scan of Sep 20, 2018. FINDINGS: The bowel gas pattern is normal. Right renal calculus is noted. Right ureteral calculus is noted to the right of L3-4 disc space. IMPRESSION: Right renal and ureteral calculi are noted as described above. No evidence of bowel obstruction or ileus. Electronically Signed   By: Lupita Raider M.D.   On: 09/21/2018 14:23   Ct Head Wo Contrast  Result Date: 09/20/2018 CLINICAL DATA:  Acute pain due to trauma EXAM: CT HEAD WITHOUT CONTRAST CT CERVICAL SPINE WITHOUT CONTRAST TECHNIQUE: Multidetector CT imaging of the head and cervical spine was performed following the standard protocol without intravenous contrast. Multiplanar CT image reconstructions  of the cervical spine were also generated. COMPARISON:  None. FINDINGS: CT HEAD FINDINGS Brain: No evidence of acute infarction, hemorrhage, hydrocephalus, extra-axial collection or mass lesion/mass effect. 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 There is significant volume loss, somewhat greater than expected for the patient's age. Vascular: No hyperdense vessel or unexpected calcification. Skull: Normal. Negative for fracture or focal lesion. Sinuses/Orbits: No acute finding. Other: None. CT CERVICAL SPINE FINDINGS Alignment: Normal. Skull base and vertebrae: No acute fracture. No primary bone lesion or focal pathologic process. Soft tissues and spinal canal: No prevertebral fluid or swelling. No visible canal hematoma. Disc levels: There is mild to moderate multilevel disc height loss throughout the cervical spine. Upper chest: Negative. Other: None IMPRESSION: 1. No acute intracranial abnormality detected. 2. No acute cervical spine fracture. 3. 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 4. Mild multilevel degenerative changes are seen involving the cervical spine. Electronically Signed   By: Katherine Mantle M.D.   On: 09/20/2018 22:06   Ct Cervical Spine Wo Contrast  Result Date: 09/20/2018 CLINICAL DATA:  Acute pain due to trauma EXAM: CT HEAD WITHOUT CONTRAST CT CERVICAL SPINE WITHOUT CONTRAST TECHNIQUE: Multidetector CT imaging of the head and cervical spine was performed following the standard protocol without intravenous contrast. Multiplanar CT image reconstructions of the cervical spine were also generated. COMPARISON:  None. FINDINGS: CT HEAD FINDINGS Brain: No evidence of acute infarction, hemorrhage, hydrocephalus, extra-axial collection or mass lesion/mass effect. 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 There is significant volume loss, somewhat greater than expected for the patient's age. Vascular: No hyperdense vessel or unexpected calcification. Skull: Normal.  Negative for fracture or focal lesion. Sinuses/Orbits: No acute finding. Other: None. CT CERVICAL SPINE FINDINGS Alignment: Normal. Skull base and vertebrae: No acute fracture. No primary bone lesion or focal pathologic process. Soft tissues and spinal canal: No prevertebral fluid or swelling. No visible canal hematoma. Disc levels: There is mild to moderate multilevel disc height loss throughout the cervical spine. Upper chest: Negative. Other: None IMPRESSION: 1. No acute intracranial abnormality detected. 2. No acute cervical spine fracture. 3. 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 4. Mild multilevel degenerative changes are seen involving the cervical spine. Electronically Signed   By: Katherine Mantle M.D.   On: 09/20/2018 22:06   Dg Chest Portable 1 View  Result Date: 09/20/2018 CLINICAL DATA:  Chest pain EXAM: PORTABLE CHEST 1 VIEW COMPARISON:  CT 09/20/2018 FINDINGS: Mild cardiomegaly. No focal consolidation or effusion. Aortic atherosclerosis. No pneumothorax. IMPRESSION: No active disease.  Mild cardiomegaly. Electronically Signed   By: Jasmine Pang M.D.   On: 09/20/2018 22:15   Ct Renal Stone Study  Result Date: 09/20/2018 CLINICAL DATA:  78 year old male with right flank pain since yesterday with vomiting. EXAM: CT ABDOMEN AND PELVIS WITHOUT CONTRAST TECHNIQUE: Multidetector CT imaging of the abdomen and pelvis was performed following the standard protocol without IV contrast. COMPARISON:  None available. FINDINGS: Lower chest: Noncalcified posterior pleural plaque in the right lung, posterolateral pleural plaque in the left lower lobe (series 7, image 4). Associated bilateral lung base increased reticular opacity, possibly developing fibrosis. No definite pleural fluid. No pericardial effusion. Calcified coronary artery atherosclerosis and/or stents. Hepatobiliary: Negative noncontrast liver and gallbladder. Pancreas: Negative aside from some atrophy. Spleen: Negative.  Adrenals/Urinary Tract: Normal adrenal glands. Negative noncontrast left kidney and left ureter. Unremarkable urinary bladder. The distal right ureter is normal. There is mild-to-moderate right pararenal stranding and moderate right periureteral stranding which tracks caudally in the right retroperitoneal space (series 3, image 56). There is a large oval 8 x 9 millimeter calculus in the proximal right ureter about 15 millimeters distal to the right ureteropelvic junction with hydronephrosis. There is an additional 6 millimeter right intrarenal calculus near the midpole. Stomach/Bowel: Decompressed and negative descending, rectosigmoid colon. Negative transverse colon. There is mild stranding in the right gutter but this appears related to the right pararenal space. No convincing large bowel inflammation. Negative terminal ileum. Diminutive or absent appendix. No dilated small bowel. Small fat containing hiatal hernia. Negative stomach. No free air. Vascular/Lymphatic: Vascular patency is not evaluated in the absence of IV contrast. Extensive Aortoiliac calcified atherosclerosis. 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 No lymphadenopathy. Reproductive: Small fat containing right inguinal hernia. Other: Trace pelvic free fluid ventral to the rectum on series 3, image 79. Simple fluid density. Musculoskeletal: Osteopenia. No acute osseous abnormality identified. IMPRESSION: 1. Acute obstructive uropathy on the right with a large 8 x 9 mm proximal  right ureteral calculus about 15 mm distal to the right UPJ. Possible forniceal rupture. 2. 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 Aortic Atherosclerosis (ICD10-I70.0). 3. 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 4. Small fat containing hiatal and right inguinal hernias. Electronically Signed   By: Odessa Fleming M.D.    On: 09/20/2018 20:27    ____________________________________________   PROCEDURES  Procedure(s) performed:   Procedures  None ____________________________________________   INITIAL IMPRESSION / ASSESSMENT AND PLAN / ED COURSE  Pertinent labs & imaging results that were available during my care of the patient were reviewed by me and considered in my medical decision making (see chart for details).   Patient presents to the ED with right flank pain with nausea/vomiting. H/o kidney stones in the past. No CP or SOB symptoms. Overall well-appearing. Plan for labs, UA, and CT renal. No infection symptoms. Low suspicion for infected stone.   CT pending. Care transferred to [facility], PA-C pending CT.    ____________________________________________  FINAL CLINICAL IMPRESSION(S) / ED DIAGNOSES  Final diagnoses:  Chest pain, unspecified type     MEDICATIONS GIVEN DURING THIS VISIT:  Medications  aspirin EC tablet 81 mg (81 mg Oral Given 09/21/18 0812)  nitroGLYCERIN (NITROSTAT) SL tablet 0.4 mg (has no administration in time range)  acetaminophen (TYLENOL) tablet 650 mg (has no administration in time range)  ondansetron (ZOFRAN) injection 4 mg (has no administration in time range)  0.9 %  sodium chloride infusion ( Intravenous Stopped 09/21/18 1051)  tamsulosin (FLOMAX) capsule 0.4 mg (0.4 mg Oral Given 09/21/18 1700)  0.9 %  sodium chloride infusion ( Intravenous Restarted 09/21/18 1347)  oxyCODONE-acetaminophen (PERCOCET/ROXICET) 5-325 MG per tablet 1-2 tablet (has no administration in time range)  morphine 2 MG/ML injection 1 mg (has no administration in time range)  atenolol (TENORMIN) tablet 25 mg (25 mg Oral Given 09/21/18 1614)  morphine 2 MG/ML injection 2 mg (2 mg Intravenous Given 09/20/18 2218)    Note:  This document was prepared using Dragon voice recognition software and may include unintentional dictation errors.  Alona Bene, MD Emergency Medicine    Helane Briceno,  Arlyss Repress, MD 09/21/18 [phone]

## 2018-09-20 NOTE — ED Notes (Signed)
ED Provider at bedside. 

## 2018-09-21 ENCOUNTER — Inpatient Hospital Stay (HOSPITAL_COMMUNITY): Payer: Medicare Other

## 2018-09-21 ENCOUNTER — Other Ambulatory Visit: Payer: Self-pay

## 2018-09-21 ENCOUNTER — Observation Stay (HOSPITAL_COMMUNITY): Payer: Medicare Other

## 2018-09-21 ENCOUNTER — Encounter (HOSPITAL_COMMUNITY): Payer: Self-pay | Admitting: Internal Medicine

## 2018-09-21 DIAGNOSIS — R109 Unspecified abdominal pain: Secondary | ICD-10-CM

## 2018-09-21 DIAGNOSIS — I4819 Other persistent atrial fibrillation: Secondary | ICD-10-CM | POA: Diagnosis not present

## 2018-09-21 DIAGNOSIS — Z7982 Long term (current) use of aspirin: Secondary | ICD-10-CM | POA: Diagnosis not present

## 2018-09-21 DIAGNOSIS — I4510 Unspecified right bundle-branch block: Secondary | ICD-10-CM | POA: Diagnosis not present

## 2018-09-21 DIAGNOSIS — R079 Chest pain, unspecified: Secondary | ICD-10-CM | POA: Diagnosis present

## 2018-09-21 DIAGNOSIS — N201 Calculus of ureter: Secondary | ICD-10-CM | POA: Diagnosis present

## 2018-09-21 DIAGNOSIS — N1330 Unspecified hydronephrosis: Secondary | ICD-10-CM | POA: Diagnosis not present

## 2018-09-21 DIAGNOSIS — Z87891 Personal history of nicotine dependence: Secondary | ICD-10-CM | POA: Diagnosis not present

## 2018-09-21 DIAGNOSIS — Z7902 Long term (current) use of antithrombotics/antiplatelets: Secondary | ICD-10-CM | POA: Diagnosis not present

## 2018-09-21 DIAGNOSIS — Z955 Presence of coronary angioplasty implant and graft: Secondary | ICD-10-CM | POA: Diagnosis not present

## 2018-09-21 DIAGNOSIS — I25118 Atherosclerotic heart disease of native coronary artery with other forms of angina pectoris: Secondary | ICD-10-CM | POA: Diagnosis not present

## 2018-09-21 DIAGNOSIS — I252 Old myocardial infarction: Secondary | ICD-10-CM | POA: Diagnosis not present

## 2018-09-21 DIAGNOSIS — N289 Disorder of kidney and ureter, unspecified: Secondary | ICD-10-CM | POA: Diagnosis not present

## 2018-09-21 DIAGNOSIS — I482 Chronic atrial fibrillation, unspecified: Secondary | ICD-10-CM | POA: Diagnosis not present

## 2018-09-21 DIAGNOSIS — Z20828 Contact with and (suspected) exposure to other viral communicable diseases: Secondary | ICD-10-CM | POA: Diagnosis not present

## 2018-09-21 DIAGNOSIS — I2510 Atherosclerotic heart disease of native coronary artery without angina pectoris: Secondary | ICD-10-CM

## 2018-09-21 DIAGNOSIS — N132 Hydronephrosis with renal and ureteral calculous obstruction: Secondary | ICD-10-CM | POA: Diagnosis not present

## 2018-09-21 DIAGNOSIS — N139 Obstructive and reflux uropathy, unspecified: Secondary | ICD-10-CM | POA: Diagnosis not present

## 2018-09-21 LAB — CBC
HCT: 42.3 % (ref 39.0–52.0)
Hemoglobin: 13.6 g/dL (ref 13.0–17.0)
MCH: 26.9 pg (ref 26.0–34.0)
MCHC: 32.2 g/dL (ref 30.0–36.0)
MCV: 83.6 fL (ref 80.0–100.0)
Platelets: 148 10*3/uL — ABNORMAL LOW (ref 150–400)
RBC: 5.06 MIL/uL (ref 4.22–5.81)
RDW: 15.3 % (ref 11.5–15.5)
WBC: 7.3 10*3/uL (ref 4.0–10.5)
nRBC: 0 % (ref 0.0–0.2)

## 2018-09-21 LAB — TROPONIN I
Troponin I: <0.03 ng/mL (ref <0.03)
Troponin I: <0.03 ng/mL (ref <0.03)

## 2018-09-21 LAB — ECHOCARDIOGRAM COMPLETE
Height: 77 in
Weight: 3451.2 oz

## 2018-09-21 LAB — BASIC METABOLIC PANEL
Anion gap: 12 (ref 5–15)
BUN: 22 mg/dL (ref 8–23)
CO2: 20 mmol/L — ABNORMAL LOW (ref 22–32)
Calcium: 9.2 mg/dL (ref 8.9–10.3)
Chloride: 108 mmol/L (ref 98–111)
Creatinine, Ser: 1.7 mg/dL — ABNORMAL HIGH (ref 0.61–1.24)
GFR calc Af Amer: 44 mL/min — ABNORMAL LOW (ref >60)
GFR calc non Af Amer: 38 mL/min — ABNORMAL LOW (ref >60)
Glucose, Bld: 99 mg/dL (ref 70–99)
Potassium: 3.6 mmol/L (ref 3.5–5.1)
Sodium: 140 mmol/L (ref 135–145)

## 2018-09-21 LAB — CREATININE, URINE, RANDOM: Creatinine, Urine: 126.36 mg/dL

## 2018-09-21 LAB — SODIUM, URINE, RANDOM: Sodium, Ur: 132 mmol/L

## 2018-09-21 MED ORDER — ATENOLOL 25 MG PO TABS
25.0000 mg | ORAL_TABLET | Freq: Every day | ORAL | Status: DC
  Administered 2018-09-21 – 2018-09-23 (×3): 25 mg via ORAL
  Filled 2018-09-21 (×4): qty 1

## 2018-09-21 MED ORDER — TAMSULOSIN HCL 0.4 MG PO CAPS
0.4000 mg | ORAL_CAPSULE | Freq: Every day | ORAL | Status: DC
  Administered 2018-09-21 – 2018-09-22 (×2): 0.4 mg via ORAL
  Filled 2018-09-21 (×2): qty 1

## 2018-09-21 MED ORDER — SODIUM CHLORIDE 0.9 % IV SOLN
INTRAVENOUS | Status: AC
  Administered 2018-09-21 – 2018-09-22 (×2): via INTRAVENOUS

## 2018-09-21 MED ORDER — OXYCODONE-ACETAMINOPHEN 5-325 MG PO TABS
1.0000 | ORAL_TABLET | ORAL | Status: DC | PRN
  Administered 2018-09-21 – 2018-09-23 (×2): 2 via ORAL
  Filled 2018-09-21 (×2): qty 2

## 2018-09-21 MED ORDER — OXYCODONE-ACETAMINOPHEN 5-325 MG PO TABS
1.0000 | ORAL_TABLET | ORAL | Status: DC | PRN
  Administered 2018-09-21: 2 via ORAL
  Filled 2018-09-21: qty 2

## 2018-09-21 MED ORDER — MORPHINE SULFATE (PF) 2 MG/ML IV SOLN: 1.0000 mg | INTRAVENOUS | Status: DC | PRN

## 2018-09-21 NOTE — Plan of Care (Signed)

## 2018-09-21 NOTE — Consult Note (Signed)
Urology Consult   Physician requesting consult: Dr. Jessica Vann  Reason for consult: Right ureteral stone  History of Present Illness: Mathew Bennett is a 77 y.o. with a history of recurrent urolithiasis who has undergone various surgical interventions in the past including ureteroscopy and ESWL.  He developed the acute onset of right-sided flank pain with radiation around his right lower abdomen down to his right groin 2 days ago.  This was consistent with his prior kidney stone pain.  He presented to the emergency room last evening and underwent an evaluation including a CT scan that demonstrated an 8 to 9 mm proximal right ureteral stone.  This was radiopaque on [facility].  He was going to be discharged home with pain medication but then developed chest pain.  He does have a history of coronary artery disease and atrial fibrillation.  He is status post cardiac stent placement in the past.  He states that his cardiologist had been in [place] but had now left that town.  He states that he had not seen his cardiologist in over 1 year.  He has been maintained chronically on aspirin and Plavix due to his history of his cardiac stent.  Stent was placed many years ago although the patient cannot remember exactly when.  During his admission, he has been evaluated and ruled out for an acute infarction.  He has had 3 troponin levels that have all been negative.  Currently, he is no longer having chest pain.  His Plavix has been on hold since his admission but he did receive this medication yesterday at home.   Past Medical History:  Diagnosis Date  . Atrial fibrillation (HCC)   . Heart attack (HCC)   . Kidney stone    kidney stone  . Pneumonia     Past Surgical History:  Procedure Laterality Date  . CARDIAC CATHETERIZATION     with stent placement    Current Hospital Medications:  Home Meds:  No current facility-administered medications on file prior to encounter.    Current Outpatient  Medications on File Prior to Encounter  Medication Sig Dispense Refill  . aspirin 81 MG chewable tablet Chew 81 mg by mouth daily.    . atenolol (TENORMIN) 50 MG tablet Take 25 mg by mouth daily.    . atorvastatin (LIPITOR) 40 MG tablet Take 40 mg by mouth daily at 6 PM.     . clopidogrel (PLAVIX) 75 MG tablet Take 75 mg by mouth every evening.     . Esomeprazole Magnesium (NEXIUM PO) Take 1 capsule by mouth daily.    . fenofibrate 160 MG tablet Take 160 mg by mouth every evening.     . sertraline (ZOLOFT) 100 MG tablet Take 100 mg by mouth daily.    . tamsulosin (FLOMAX) 0.4 MG CAPS capsule Take 0.4 mg by mouth daily.       Scheduled Meds: . aspirin EC  81 mg Oral Daily  . tamsulosin  0.4 mg Oral QPC supper   Continuous Infusions: . sodium chloride     PRN Meds:.acetaminophen, morphine injection, nitroGLYCERIN, ondansetron (ZOFRAN) IV, oxyCODONE-acetaminophen  Allergies: No Known Allergies  History reviewed. No pertinent family history.  Social History:  reports that he has quit smoking. He has never used smokeless tobacco. No history on file for alcohol and drug.  ROS: A complete review of systems was performed.  All systems are negative except for pertinent findings as noted.  Physical Exam:  Vital signs in last 24   hours: Temp:  [97.9 F (36.6 C)-98.3 F (36.8 C)] 97.9 F (36.6 C) (05/20 1135) Pulse Rate:  [55-100] 91 (05/20 1135) Resp:  [14-23] 20 (05/20 1135) BP: (116-159)/(52-94) 145/94 (05/20 1135) SpO2:  [95 %-99 %] 95 % (05/20 1135) Weight:  [97.8 kg-100.2 kg] 97.8 kg (05/20 0007) Constitutional:  Alert and oriented, No acute distress Cardiovascular: Regular rate and rhythm, No JVD Respiratory: Normal respiratory effort, Lungs clear bilaterally GI: Soft, nondistended.  He does have right abdominal and right lower quadrant tenderness. GU: Moderate right CVA tenderness.  No left CVA tenderness. Lymphatic: No lymphadenopathy Neurologic: Grossly intact, no focal  deficits Psychiatric: Normal mood and affect  Laboratory Data:  Recent Labs    09/20/18 1715 09/21/18 0418  WBC 7.0 7.3  HGB 13.8 13.6  HCT 43.5 42.3  PLT 155 148*    Recent Labs    09/20/18 1715 09/21/18 0418  NA 139 140  K 3.6 3.6  CL 107 108  GLUCOSE 108* 99  BUN 20 22  CALCIUM 9.3 9.2  CREATININE 1.70* 1.70*     Results for orders placed or performed during the hospital encounter of 09/20/18 (from the past 24 hour(s))  Comprehensive metabolic panel     Status: Abnormal   Collection Time: 09/20/18  5:15 PM  Result Value Ref Range   Sodium 139 135 - 145 mmol/L   Potassium 3.6 3.5 - 5.1 mmol/L   Chloride 107 98 - 111 mmol/L   CO2 22 22 - 32 mmol/L   Glucose, Bld 108 (H) 70 - 99 mg/dL   BUN 20 8 - 23 mg/dL   Creatinine, Ser 1.70 (H) 0.61 - 1.24 mg/dL   Calcium 9.3 8.9 - 10.3 mg/dL   Total Protein 6.4 (L) 6.5 - 8.1 g/dL   Albumin 3.4 (L) 3.5 - 5.0 g/dL   AST 33 15 - 41 U/L   ALT 17 0 - 44 U/L   Alkaline Phosphatase 49 38 - 126 U/L   Total Bilirubin 1.3 (H) 0.3 - 1.2 mg/dL   GFR calc non Af Amer 38 (L) >60 mL/min   GFR calc Af Amer 44 (L) >60 mL/min   Anion gap 10 5 - 15  CBC with Differential     Status: Abnormal   Collection Time: 09/20/18  5:15 PM  Result Value Ref Range   WBC 7.0 4.0 - 10.5 K/uL   RBC 5.16 4.22 - 5.81 MIL/uL   Hemoglobin 13.8 13.0 - 17.0 g/dL   HCT 43.5 39.0 - 52.0 %   MCV 84.3 80.0 - 100.0 fL   MCH 26.7 26.0 - 34.0 pg   MCHC 31.7 30.0 - 36.0 g/dL   RDW 15.2 11.5 - 15.5 %   Platelets 155 150 - 400 K/uL   nRBC 0.0 0.0 - 0.2 %   Neutrophils Relative % 80 %   Neutro Abs 5.7 1.7 - 7.7 K/uL   Lymphocytes Relative 8 %   Lymphs Abs 0.6 (L) 0.7 - 4.0 K/uL   Monocytes Relative 11 %   Monocytes Absolute 0.8 0.1 - 1.0 K/uL   Eosinophils Relative 0 %   Eosinophils Absolute 0.0 0.0 - 0.5 K/uL   Basophils Relative 0 %   Basophils Absolute 0.0 0.0 - 0.1 K/uL   Immature Granulocytes 1 %   Abs Immature Granulocytes 0.04 0.00 - 0.07 K/uL   Urinalysis, Routine w reflex microscopic     Status: Abnormal   Collection Time: 09/20/18  7:40 PM  Result Value Ref Range     Color, Urine YELLOW YELLOW   APPearance CLEAR CLEAR   Specific Gravity, Urine 1.028 1.005 - 1.030   pH 5.0 5.0 - 8.0   Glucose, UA NEGATIVE NEGATIVE mg/dL   Hgb urine dipstick SMALL (A) NEGATIVE   Bilirubin Urine NEGATIVE NEGATIVE   Ketones, ur NEGATIVE NEGATIVE mg/dL   Protein, ur 30 (A) NEGATIVE mg/dL   Nitrite NEGATIVE NEGATIVE   Leukocytes,Ua NEGATIVE NEGATIVE   RBC / HPF 0-5 0 - 5 RBC/hpf   WBC, UA 0-5 0 - 5 WBC/hpf   Bacteria, UA NONE SEEN NONE SEEN   Mucus PRESENT   Troponin I - ONCE - STAT     Status: None   Collection Time: 09/20/18  9:36 PM  Result Value Ref Range   Troponin I <0.03 <0.03 ng/mL  SARS Coronavirus 2 (CEPHEID - Performed in [place] hospital lab), Hosp Order     Status: None   Collection Time: 09/20/18 10:16 PM  Result Value Ref Range   SARS Coronavirus 2 NEGATIVE NEGATIVE  Glucose, capillary     Status: None   Collection Time: 09/20/18 11:35 PM  Result Value Ref Range   Glucose-Capillary 95 70 - 99 mg/dL  Basic metabolic panel     Status: Abnormal   Collection Time: 09/21/18  4:18 AM  Result Value Ref Range   Sodium 140 135 - 145 mmol/L   Potassium 3.6 3.5 - 5.1 mmol/L   Chloride 108 98 - 111 mmol/L   CO2 20 (L) 22 - 32 mmol/L   Glucose, Bld 99 70 - 99 mg/dL   BUN 22 8 - 23 mg/dL   Creatinine, Ser 1.70 (H) 0.61 - 1.24 mg/dL   Calcium 9.2 8.9 - 10.3 mg/dL   GFR calc non Af Amer 38 (L) >60 mL/min   GFR calc Af Amer 44 (L) >60 mL/min   Anion gap 12 5 - 15  CBC     Status: Abnormal   Collection Time: 09/21/18  4:18 AM  Result Value Ref Range   WBC 7.3 4.0 - 10.5 K/uL   RBC 5.06 4.22 - 5.81 MIL/uL   Hemoglobin 13.6 13.0 - 17.0 g/dL   HCT 42.3 39.0 - 52.0 %   MCV 83.6 80.0 - 100.0 fL   MCH 26.9 26.0 - 34.0 pg   MCHC 32.2 30.0 - 36.0 g/dL   RDW 15.3 11.5 - 15.5 %   Platelets 148 (L) 150 - 400 K/uL   nRBC 0.0 0.0 -  0.2 %  Troponin I - Once-Timed     Status: None   Collection Time: 09/21/18  4:18 AM  Result Value Ref Range   Troponin I <0.03 <0.03 ng/mL  Troponin I - Once     Status: None   Collection Time: 09/21/18  9:09 AM  Result Value Ref Range   Troponin I <0.03 <0.03 ng/mL   Recent Results (from the past 240 hour(s))  SARS Coronavirus 2 (CEPHEID - Performed in [place] hospital lab), Hosp Order     Status: None   Collection Time: 09/20/18 10:16 PM  Result Value Ref Range Status   SARS Coronavirus 2 NEGATIVE NEGATIVE Final    Comment: (NOTE) If result is NEGATIVE SARS-CoV-2 target nucleic acids are NOT DETECTED. The SARS-CoV-2 RNA is generally detectable in upper and lower  respiratory specimens during the acute phase of infection. The lowest  concentration of SARS-CoV-2 viral copies this assay can detect is 250  copies / mL. A negative result does not preclude SARS-CoV-2 infection    and should not be used as the sole basis for treatment or other  patient management decisions.  A negative result may occur with  improper specimen collection / handling, submission of specimen other  than nasopharyngeal swab, presence of viral mutation(s) within the  areas targeted by this assay, and inadequate number of viral copies  (<250 copies / mL). A negative result must be combined with clinical  observations, patient history, and epidemiological information. If result is POSITIVE SARS-CoV-2 target nucleic acids are DETECTED. The SARS-CoV-2 RNA is generally detectable in upper and lower  respiratory specimens dur ing the acute phase of infection.  Positive  results are indicative of active infection with SARS-CoV-2.  Clinical  correlation with patient history and other diagnostic information is  necessary to determine patient infection status.  Positive results do  not rule out bacterial infection or co-infection with other viruses. If result is PRESUMPTIVE POSTIVE SARS-CoV-2 nucleic acids MAY  BE PRESENT.   A presumptive positive result was obtained on the submitted specimen  and confirmed on repeat testing.  While 2019 novel coronavirus  (SARS-CoV-2) nucleic acids may be present in the submitted sample  additional confirmatory testing may be necessary for epidemiological  and / or clinical management purposes  to differentiate between  SARS-CoV-2 and other Sarbecovirus currently known to infect humans.  If clinically indicated additional testing with an alternate test  methodology ([id number]) is advised. The SARS-CoV-2 RNA is generally  detectable in upper and lower respiratory sp ecimens during the acute  phase of infection. The expected result is Negative. Fact Sheet for Patients:  https://www.fda.gov/media/136312/download Fact Sheet for Healthcare Providers: https://www.fda.gov/media/136313/download This test is not yet approved or cleared by the [place] FDA and has been authorized for detection and/or diagnosis of SARS-CoV-2 by FDA under an Emergency Use Authorization (EUA).  This EUA will remain in effect (meaning this test can be used) for the duration of the COVID-19 declaration under Section 564(b)(1) of the Act, 21 U.S.C. section 360bbb-3(b)(1), unless the authorization is terminated or revoked sooner. Performed at [facility], [address] [place], [place] [address]     Renal Function: Recent Labs    09/20/18 1715 09/21/18 0418  CREATININE 1.70* 1.70*   Estimated Creatinine Clearance: 45.1 mL/min (A) (by C-G formula based on SCr of 1.7 mg/dL (H)).  Radiologic Imaging: Ct Head Wo Contrast  Result Date: 09/20/2018 CLINICAL DATA:  Acute pain due to trauma EXAM: CT HEAD WITHOUT CONTRAST CT CERVICAL SPINE WITHOUT CONTRAST TECHNIQUE: Multidetector CT imaging of the head and cervical spine was performed following the standard protocol without intravenous contrast. Multiplanar CT image reconstructions of the cervical spine were also generated.  COMPARISON:  None. FINDINGS: CT HEAD FINDINGS Brain: No evidence of acute infarction, hemorrhage, hydrocephalus, extra-axial collection or mass lesion/mass effect. 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 There is significant volume loss, somewhat greater than expected for the patient's age. Vascular: No hyperdense vessel or unexpected calcification. Skull: Normal. Negative for fracture or focal lesion. Sinuses/Orbits: No acute finding. Other: None. CT CERVICAL SPINE FINDINGS Alignment: Normal. Skull base and vertebrae: No acute fracture. No primary bone lesion or focal pathologic process. Soft tissues and spinal canal: No prevertebral fluid or swelling. No visible canal hematoma. Disc levels: There is mild to moderate multilevel disc height loss throughout the cervical spine. Upper chest: Negative. Other: None IMPRESSION: 1. No acute intracranial abnormality detected. 2. No acute cervical spine fracture. 3. 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 4. Mild multilevel degenerative changes   are seen involving the cervical spine. Electronically Signed   By: Christopher  Green M.D.   On: 09/20/2018 22:06   Ct Cervical Spine Wo Contrast  Result Date: 09/20/2018 CLINICAL DATA:  Acute pain due to trauma EXAM: CT HEAD WITHOUT CONTRAST CT CERVICAL SPINE WITHOUT CONTRAST TECHNIQUE: Multidetector CT imaging of the head and cervical spine was performed following the standard protocol without intravenous contrast. Multiplanar CT image reconstructions of the cervical spine were also generated. COMPARISON:  None. FINDINGS: CT HEAD FINDINGS Brain: No evidence of acute infarction, hemorrhage, hydrocephalus, extra-axial collection or mass lesion/mass effect. 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 There is significant volume loss, somewhat greater than expected for the patient's age. Vascular: No hyperdense vessel or unexpected calcification. Skull: Normal. Negative for fracture or focal lesion.  Sinuses/Orbits: No acute finding. Other: None. CT CERVICAL SPINE FINDINGS Alignment: Normal. Skull base and vertebrae: No acute fracture. No primary bone lesion or focal pathologic process. Soft tissues and spinal canal: No prevertebral fluid or swelling. No visible canal hematoma. Disc levels: There is mild to moderate multilevel disc height loss throughout the cervical spine. Upper chest: Negative. Other: None IMPRESSION: 1. No acute intracranial abnormality detected. 2. No acute cervical spine fracture. 3. 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 4. Mild multilevel degenerative changes are seen involving the cervical spine. Electronically Signed   By: Christopher  Green M.D.   On: 09/20/2018 22:06   Dg Chest Portable 1 View  Result Date: 09/20/2018 CLINICAL DATA:  Chest pain EXAM: PORTABLE CHEST 1 VIEW COMPARISON:  CT 09/20/2018 FINDINGS: Mild cardiomegaly. No focal consolidation or effusion. Aortic atherosclerosis. No pneumothorax. IMPRESSION: No active disease.  Mild cardiomegaly. Electronically Signed   By: Kim  Fujinaga M.D.   On: 09/20/2018 22:15   Ct Renal Stone Study  Result Date: 09/20/2018 CLINICAL DATA:  77-year-old male with right flank pain since yesterday with vomiting. EXAM: CT ABDOMEN AND PELVIS WITHOUT CONTRAST TECHNIQUE: Multidetector CT imaging of the abdomen and pelvis was performed following the standard protocol without IV contrast. COMPARISON:  None available. FINDINGS: Lower chest: Noncalcified posterior pleural plaque in the right lung, posterolateral pleural plaque in the left lower lobe (series 7, image 4). Associated bilateral lung base increased reticular opacity, possibly developing fibrosis. No definite pleural fluid. No pericardial effusion. Calcified coronary artery atherosclerosis and/or stents. Hepatobiliary: Negative noncontrast liver and gallbladder. Pancreas: Negative aside from some atrophy. Spleen: Negative. Adrenals/Urinary Tract: Normal adrenal glands.  Negative noncontrast left kidney and left ureter. Unremarkable urinary bladder. The distal right ureter is normal. There is mild-to-moderate right pararenal stranding and moderate right periureteral stranding which tracks caudally in the right retroperitoneal space (series 3, image 56). There is a large oval 8 x 9 millimeter calculus in the proximal right ureter about 15 millimeters distal to the right ureteropelvic junction with hydronephrosis. There is an additional 6 millimeter right intrarenal calculus near the midpole. Stomach/Bowel: Decompressed and negative descending, rectosigmoid colon. Negative transverse colon. There is mild stranding in the right gutter but this appears related to the right pararenal space. No convincing large bowel inflammation. Negative terminal ileum. Diminutive or absent appendix. No dilated small bowel. Small fat containing hiatal hernia. Negative stomach. No free air. Vascular/Lymphatic: Vascular patency is not evaluated in the absence of IV contrast. Extensive Aortoiliac calcified atherosclerosis. 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 No lymphadenopathy. Reproductive: Small fat containing right inguinal hernia. Other: Trace pelvic free fluid ventral to the rectum on series 3,   image 79. Simple fluid density. Musculoskeletal: Osteopenia. No acute osseous abnormality identified. IMPRESSION: 1. Acute obstructive uropathy on the right with a large 8 x 9 mm proximal right ureteral calculus about 15 mm distal to the right UPJ. Possible forniceal rupture. 2. 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 Aortic Atherosclerosis (ICD10-I70.0). 3. 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 4. Small fat containing hiatal and right inguinal hernias. Electronically Signed   By: H  Hall M.D.   On: 09/20/2018 20:27    I independently  reviewed the above imaging studies.  Impression/Recommendation #1.  Right proximal ureteral calculus: Considering the size of his stone, it is unlikely to pass.  Currently, his pain is manageable with pain medication.  We therefore reviewed options for intervention including shockwave lithotripsy versus ureteroscopic laser lithotripsy.  We reviewed the pros and cons of each approach.  As his stone is outside the renal shadow on KUB imaging, he could likely proceed with shockwave lithotripsy on aspirin 81 mg but it would be recommended for him to be off Plavix for at least 5 days.  Shockwave lithotripsy would avoid a general anesthetic and could be performed under IV sedation.  However, he understands the risk that some stones may not fragment as well as others.  However, he has had good success with shockwave lithotripsy in the past apparently.  He understands that ureteroscopy would require a general anesthetic but would have a very low risk of bleeding and could potentially be performed sooner if necessary as he would not have to be off his Plavix for an extended period of time for this procedure.  Ultimately, he is most interested in proceeding with shockwave lithotripsy for treatment.  Based on the requirements for lithotripsy, since he has not seen his cardiologist in over a year, he would need cardiac clearance/risk assessment considering his history of atrial fibrillation and prior coronary artery disease before he could proceed.  If this can be performed during his hospitalization, this will help to expedite his care.  He can then resume his diet today and be administered oral pain medication.  I will reevaluate him in the morning.  If his pain is controlled, he can then be discharged home with plans to proceed with shockwave lithotripsy as an outpatient.  The lithotripsy is available on Monday and Thursdays.  Due to the fact that Monday is a holiday, the next opening for him would likely be next  Thursday.  We have reviewed the potential risks, complications, and expected recovery process associated with this procedure.  He gives informed consent to proceed if appropriate.  Les Kallie Depolo 09/21/2018, 11:56 AM    Marita Burnsed S. Sahan Pen, Jr. MD   CC: Dr. Jessica Vann 

## 2018-09-21 NOTE — Progress Notes (Signed)
Echocardiogram 2D Echocardiogram has been performed.  Pieter Partridge 09/21/2018, 2:56 PM

## 2018-09-21 NOTE — Progress Notes (Signed)
[facility] PROGRESS NOTE  Mathew Bennett BMS:[id number] DOB: 02-01-1941 DOA: 09/20/2018 PCP: Larene Pickett, FNP  Assessment/Plan: 1. Chest pain; CAD  resolved this am. He presented with 3 days of right flank pain and then acute-onset of chest pain with nausea and diaphoresis just prior to arrival  Troponin negative x3,  CXR without acute findings, and EKG with a fib, PVC, and RBBB no acute changes. Patient reports hx of CAD with stents. Home meds include plavix. Received asa as well. Holding plavix for now  2. Right ureteral obstructing stone/hydronephrosis Presented with 3 days of right flank pain, reported history of nephrolithiasis. Work up revealed obstructing right proximal ureteral stone measuring 8 x 9 mm with possible forniceal rupture. Urology contacted in ED and opined no emergent procedure needed and recommended that patient be seen in urology clinic tomorrow if pain can be controlled. IV morphine required during night for pain with only fair control. Continues with flank pain radiating to lower abdomen. No fever, nausea. Decreased oral intake.  -request urology consult inpatient -introduce oral pain med with IV for breakthrough -continue gentle IV fluids -monitor   3. Renal insufficiency  SCr is stable at 1.70 No prior labs for comparison Right-sided ureteral obstruction noted on CT as above  Unclear chronicity, possibly some acute prerenal injury component from N/V -gentle IV fluids -hold nephrotoxins - Urine chemistries pending.  -bmet in am   4. Atrial fibrillation remains rate controlled. CHADS-VASc is at least 79 (age x2, CAD) Home meds include Plavix, atenolol -hold plavix -resume atenolol     Code Status: full Family Communication:  Disposition Plan: home   Consultants:  [facility]  Procedures:    Antibiotics:    HPI/Subjective: 78 yo hx cad s/p stent on plavix, afib, admitted with obstructing ureter calculus and hydronephrosis. Urology  consult requested  Objective: Vitals:   09/21/18 0811 09/21/18 1135  BP: (!) 159/81 (!) 145/94  Pulse: 83 91  Resp: 20 20  Temp:  97.9 F (36.6 C)  SpO2: 97% 95%    Intake/Output Summary (Last 24 hours) at 09/21/2018 1140 Last data filed at 09/21/2018 1034 Gross per 24 hour  Intake 0 ml  Output 500 ml  Net -500 ml   Filed Weights   09/20/18 1648 09/21/18 0007  Weight: 100.2 kg 97.8 kg    Exam:   General:  Sitting on side of bed reports pain right flank. Mild distress  Cardiovascular: irregularly irregular no mgr no LE edema  Respiratory: normal effort BS clear bilaterally no wheeze  Abdomen: obese soft +BS no guarding or rebounding. Right flank tender  Musculoskeletal: joints without swelling/erythema   Data Reviewed: Basic Metabolic Panel: Recent Labs  Lab 09/20/18 1715 09/21/18 0418  NA 139 140  K 3.6 3.6  CL 107 108  CO2 22 20*  GLUCOSE 108* 99  BUN 20 22  CREATININE 1.70* 1.70*  CALCIUM 9.3 9.2   Liver Function Tests: Recent Labs  Lab 09/20/18 1715  AST 33  ALT 17  ALKPHOS 49  BILITOT 1.3*  PROT 6.4*  ALBUMIN 3.4*   No results for input(s): LIPASE, AMYLASE in the last 168 hours. No results for input(s): AMMONIA in the last 168 hours. CBC: Recent Labs  Lab 09/20/18 1715 09/21/18 0418  WBC 7.0 7.3  NEUTROABS 5.7  --   HGB 13.8 13.6  HCT 43.5 42.3  MCV 84.3 83.6  PLT 155 148*   Cardiac Enzymes: Recent Labs  Lab 09/20/18 2136 09/21/18 0418 09/21/18 2233  TROPONINI <0.03 <0.03 <0.03   BNP (last 3 results) No results for input(s): BNP in the last 8760 hours.  ProBNP (last 3 results) No results for input(s): PROBNP in the last 8760 hours.  CBG: Recent Labs  Lab 09/20/18 2335  GLUCAP 95    Recent Results (from the past 240 hour(s))  SARS Coronavirus 2 (CEPHEID - Performed in [facility] hospital lab), Hosp Order     Status: None   Collection Time: 09/20/18 10:16 PM  Result Value Ref Range Status   SARS Coronavirus 2  NEGATIVE NEGATIVE Final    Comment: (NOTE) If result is NEGATIVE SARS-CoV-2 target nucleic acids are NOT DETECTED. The SARS-CoV-2 RNA is generally detectable in upper and lower  respiratory specimens during the acute phase of infection. The lowest  concentration of SARS-CoV-2 viral copies this assay can detect is 250  copies / mL. A negative result does not preclude SARS-CoV-2 infection  and should not be used as the sole basis for treatment or other  patient management decisions.  A negative result may occur with  improper specimen collection / handling, submission of specimen other  than nasopharyngeal swab, presence of viral mutation(s) within the  areas targeted by this assay, and inadequate number of viral copies  (<250 copies / mL). A negative result must be combined with clinical  observations, patient history, and epidemiological information. If result is POSITIVE SARS-CoV-2 target nucleic acids are DETECTED. The SARS-CoV-2 RNA is generally detectable in upper and lower  respiratory specimens dur ing the acute phase of infection.  Positive  results are indicative of active infection with SARS-CoV-2.  Clinical  correlation with patient history and other diagnostic information is  necessary to determine patient infection status.  Positive results do  not rule out bacterial infection or co-infection with other viruses. If result is PRESUMPTIVE POSTIVE SARS-CoV-2 nucleic acids MAY BE PRESENT.   A presumptive positive result was obtained on the submitted specimen  and confirmed on repeat testing.  While 2019 novel coronavirus  (SARS-CoV-2) nucleic acids may be present in the submitted sample  additional confirmatory testing may be necessary for epidemiological  and / or clinical management purposes  to differentiate between  SARS-CoV-2 and other Sarbecovirus currently known to infect humans.  If clinically indicated additional testing with an alternate test  methodology [phone])  is advised. The SARS-CoV-2 RNA is generally  detectable in upper and lower respiratory sp ecimens during the acute  phase of infection. The expected result is Negative. Fact Sheet for Patients:  BoilerBrush.com.cy Fact Sheet for Healthcare Providers: https://pope.com/ This test is not yet approved or cleared by the [place] FDA and has been authorized for detection and/or diagnosis of SARS-CoV-2 by FDA under an Emergency Use Authorization (EUA).  This EUA will remain in effect (meaning this test can be used) for the duration of the COVID-19 declaration under Section 564(b)(1) of the Act, 21 U.S.C. section 360bbb-3(b)(1), unless the authorization is terminated or revoked sooner. Performed at [facility], [address], [place], [place] [address]      Studies: Ct Head Wo Contrast  Result Date: 09/20/2018 CLINICAL DATA:  Acute pain due to trauma EXAM: CT HEAD WITHOUT CONTRAST CT CERVICAL SPINE WITHOUT CONTRAST TECHNIQUE: Multidetector CT imaging of the head and cervical spine was performed following the standard protocol without intravenous contrast. Multiplanar CT image reconstructions of the cervical spine were also generated. COMPARISON:  None. FINDINGS: CT HEAD FINDINGS Brain: No evidence of acute infarction, hemorrhage, hydrocephalus, extra-axial  collection or mass lesion/mass effect. 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 There is significant volume loss, somewhat greater than expected for the patient's age. Vascular: No hyperdense vessel or unexpected calcification. Skull: Normal. Negative for fracture or focal lesion. Sinuses/Orbits: No acute finding. Other: None. CT CERVICAL SPINE FINDINGS Alignment: Normal. Skull base and vertebrae: No acute fracture. No primary bone lesion or focal pathologic process. Soft tissues and spinal canal: No prevertebral fluid or swelling. No visible canal hematoma. Disc levels: There is  mild to moderate multilevel disc height loss throughout the cervical spine. Upper chest: Negative. Other: None IMPRESSION: 1. No acute intracranial abnormality detected. 2. No acute cervical spine fracture. 3. 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 4. Mild multilevel degenerative changes are seen involving the cervical spine. Electronically Signed   By: Katherine Mantlehristopher  Green M.D.   On: 09/20/2018 22:06   Ct Cervical Spine Wo Contrast  Result Date: 09/20/2018 CLINICAL DATA:  Acute pain due to trauma EXAM: CT HEAD WITHOUT CONTRAST CT CERVICAL SPINE WITHOUT CONTRAST TECHNIQUE: Multidetector CT imaging of the head and cervical spine was performed following the standard protocol without intravenous contrast. Multiplanar CT image reconstructions of the cervical spine were also generated. COMPARISON:  None. FINDINGS: CT HEAD FINDINGS Brain: No evidence of acute infarction, hemorrhage, hydrocephalus, extra-axial collection or mass lesion/mass effect. 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 There is significant volume loss, somewhat greater than expected for the patient's age. Vascular: No hyperdense vessel or unexpected calcification. Skull: Normal. Negative for fracture or focal lesion. Sinuses/Orbits: No acute finding. Other: None. CT CERVICAL SPINE FINDINGS Alignment: Normal. Skull base and vertebrae: No acute fracture. No primary bone lesion or focal pathologic process. Soft tissues and spinal canal: No prevertebral fluid or swelling. No visible canal hematoma. Disc levels: There is mild to moderate multilevel disc height loss throughout the cervical spine. Upper chest: Negative. Other: None IMPRESSION: 1. No acute intracranial abnormality detected. 2. No acute cervical spine fracture. 3. 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 4. Mild multilevel degenerative changes are seen involving the cervical spine. Electronically Signed   By: Katherine Mantlehristopher  Green M.D.   On: 09/20/2018 22:06   Dg  Chest Portable 1 View  Result Date: 09/20/2018 CLINICAL DATA:  Chest pain EXAM: PORTABLE CHEST 1 VIEW COMPARISON:  CT 09/20/2018 FINDINGS: Mild cardiomegaly. No focal consolidation or effusion. Aortic atherosclerosis. No pneumothorax. IMPRESSION: No active disease.  Mild cardiomegaly. Electronically Signed   By: Jasmine PangKim  Fujinaga M.D.   On: 09/20/2018 22:15   Ct Renal Stone Study  Result Date: 09/20/2018 CLINICAL DATA:  78 year old male with right flank pain since yesterday with vomiting. EXAM: CT ABDOMEN AND PELVIS WITHOUT CONTRAST TECHNIQUE: Multidetector CT imaging of the abdomen and pelvis was performed following the standard protocol without IV contrast. COMPARISON:  None available. FINDINGS: Lower chest: Noncalcified posterior pleural plaque in the right lung, posterolateral pleural plaque in the left lower lobe (series 7, image 4). Associated bilateral lung base increased reticular opacity, possibly developing fibrosis. No definite pleural fluid. No pericardial effusion. Calcified coronary artery atherosclerosis and/or stents. Hepatobiliary: Negative noncontrast liver and gallbladder. Pancreas: Negative aside from some atrophy. Spleen: Negative. Adrenals/Urinary Tract: Normal adrenal glands. Negative noncontrast left kidney and left ureter. Unremarkable urinary bladder. The distal right ureter is normal. There is mild-to-moderate right pararenal stranding and moderate right periureteral stranding which tracks caudally in the right retroperitoneal space (series 3, image 56). There is a large oval 8 x 9 millimeter calculus in the proximal right ureter about 15 millimeters distal  to the right ureteropelvic junction with hydronephrosis. There is an additional 6 millimeter right intrarenal calculus near the midpole. Stomach/Bowel: Decompressed and negative descending, rectosigmoid colon. Negative transverse colon. There is mild stranding in the right gutter but this appears related to the right pararenal space.  No convincing large bowel inflammation. Negative terminal ileum. Diminutive or absent appendix. No dilated small bowel. Small fat containing hiatal hernia. Negative stomach. No free air. Vascular/Lymphatic: Vascular patency is not evaluated in the absence of IV contrast. Extensive Aortoiliac calcified atherosclerosis. 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 No lymphadenopathy. Reproductive: Small fat containing right inguinal hernia. Other: Trace pelvic free fluid ventral to the rectum on series 3, image 79. Simple fluid density. Musculoskeletal: Osteopenia. No acute osseous abnormality identified. IMPRESSION: 1. Acute obstructive uropathy on the right with a large 8 x 9 mm proximal right ureteral calculus about 15 mm distal to the right UPJ. Possible forniceal rupture. 2. 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 Aortic Atherosclerosis (ICD10-I70.0). 3. 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 4. Small fat containing hiatal and right inguinal hernias. Electronically Signed   By: Odessa Fleming M.D.   On: 09/20/2018 20:27    Scheduled Meds: . aspirin EC  81 mg Oral Daily  . tamsulosin  0.4 mg Oral QPC supper   Continuous Infusions: . sodium chloride      Active Problems:   Acute unilateral obstructive uropathy   Ureteral calculus   Hydronephrosis   Chest pain   Renal insufficiency   Atrial fibrillation (HCC)    Time spent: 45 minutes    [facility] M NP  [facility] If 7PM-7AM, please contact night-coverage at [email or web address], password [facility] 09/21/2018, 11:40 AM  LOS: 0 days

## 2018-09-21 NOTE — H&P (View-Only) (Signed)
Urology Consult   Physician requesting consult: Dr. Jessica Vann  Reason for consult: Right ureteral stone  History of Present Illness: Mathew Bennett is a 77 y.o. with a history of recurrent urolithiasis who has undergone various surgical interventions in the past including ureteroscopy and ESWL.  He developed the acute onset of right-sided flank pain with radiation around his right lower abdomen down to his right groin 2 days ago.  This was consistent with his prior kidney stone pain.  He presented to the emergency room last evening and underwent an evaluation including a CT scan that demonstrated an 8 to 9 mm proximal right ureteral stone.  This was radiopaque on [facility].  He was going to be discharged home with pain medication but then developed chest pain.  He does have a history of coronary artery disease and atrial fibrillation.  He is status post cardiac stent placement in the past.  He states that his cardiologist had been in [place] but had now left that town.  He states that he had not seen his cardiologist in over 1 year.  He has been maintained chronically on aspirin and Plavix due to his history of his cardiac stent.  Stent was placed many years ago although the patient cannot remember exactly when.  During his admission, he has been evaluated and ruled out for an acute infarction.  He has had 3 troponin levels that have all been negative.  Currently, he is no longer having chest pain.  His Plavix has been on hold since his admission but he did receive this medication yesterday at home.   Past Medical History:  Diagnosis Date  . Atrial fibrillation (HCC)   . Heart attack (HCC)   . Kidney stone    kidney stone  . Pneumonia     Past Surgical History:  Procedure Laterality Date  . CARDIAC CATHETERIZATION     with stent placement    Current Hospital Medications:  Home Meds:  No current facility-administered medications on file prior to encounter.    Current Outpatient  Medications on File Prior to Encounter  Medication Sig Dispense Refill  . aspirin 81 MG chewable tablet Chew 81 mg by mouth daily.    . atenolol (TENORMIN) 50 MG tablet Take 25 mg by mouth daily.    . atorvastatin (LIPITOR) 40 MG tablet Take 40 mg by mouth daily at 6 PM.     . clopidogrel (PLAVIX) 75 MG tablet Take 75 mg by mouth every evening.     . Esomeprazole Magnesium (NEXIUM PO) Take 1 capsule by mouth daily.    . fenofibrate 160 MG tablet Take 160 mg by mouth every evening.     . sertraline (ZOLOFT) 100 MG tablet Take 100 mg by mouth daily.    . tamsulosin (FLOMAX) 0.4 MG CAPS capsule Take 0.4 mg by mouth daily.       Scheduled Meds: . aspirin EC  81 mg Oral Daily  . tamsulosin  0.4 mg Oral QPC supper   Continuous Infusions: . sodium chloride     PRN Meds:.acetaminophen, morphine injection, nitroGLYCERIN, ondansetron (ZOFRAN) IV, oxyCODONE-acetaminophen  Allergies: No Known Allergies  History reviewed. No pertinent family history.  Social History:  reports that he has quit smoking. He has never used smokeless tobacco. No history on file for alcohol and drug.  ROS: A complete review of systems was performed.  All systems are negative except for pertinent findings as noted.  Physical Exam:  Vital signs in last 24   hours: Temp:  [97.9 F (36.6 C)-98.3 F (36.8 C)] 97.9 F (36.6 C) (05/20 1135) Pulse Rate:  [55-100] 91 (05/20 1135) Resp:  [14-23] 20 (05/20 1135) BP: (116-159)/(52-94) 145/94 (05/20 1135) SpO2:  [95 %-99 %] 95 % (05/20 1135) Weight:  [97.8 kg-100.2 kg] 97.8 kg (05/20 0007) Constitutional:  Alert and oriented, No acute distress Cardiovascular: Regular rate and rhythm, No JVD Respiratory: Normal respiratory effort, Lungs clear bilaterally GI: Soft, nondistended.  He does have right abdominal and right lower quadrant tenderness. GU: Moderate right CVA tenderness.  No left CVA tenderness. Lymphatic: No lymphadenopathy Neurologic: Grossly intact, no focal  deficits Psychiatric: Normal mood and affect  Laboratory Data:  Recent Labs    09/20/18 1715 09/21/18 0418  WBC 7.0 7.3  HGB 13.8 13.6  HCT 43.5 42.3  PLT 155 148*    Recent Labs    09/20/18 1715 09/21/18 0418  NA 139 140  K 3.6 3.6  CL 107 108  GLUCOSE 108* 99  BUN 20 22  CALCIUM 9.3 9.2  CREATININE 1.70* 1.70*     Results for orders placed or performed during the hospital encounter of 09/20/18 (from the past 24 hour(s))  Comprehensive metabolic panel     Status: Abnormal   Collection Time: 09/20/18  5:15 PM  Result Value Ref Range   Sodium 139 135 - 145 mmol/L   Potassium 3.6 3.5 - 5.1 mmol/L   Chloride 107 98 - 111 mmol/L   CO2 22 22 - 32 mmol/L   Glucose, Bld 108 (H) 70 - 99 mg/dL   BUN 20 8 - 23 mg/dL   Creatinine, Ser 1.70 (H) 0.61 - 1.24 mg/dL   Calcium 9.3 8.9 - 10.3 mg/dL   Total Protein 6.4 (L) 6.5 - 8.1 g/dL   Albumin 3.4 (L) 3.5 - 5.0 g/dL   AST 33 15 - 41 U/L   ALT 17 0 - 44 U/L   Alkaline Phosphatase 49 38 - 126 U/L   Total Bilirubin 1.3 (H) 0.3 - 1.2 mg/dL   GFR calc non Af Amer 38 (L) >60 mL/min   GFR calc Af Amer 44 (L) >60 mL/min   Anion gap 10 5 - 15  CBC with Differential     Status: Abnormal   Collection Time: 09/20/18  5:15 PM  Result Value Ref Range   WBC 7.0 4.0 - 10.5 K/uL   RBC 5.16 4.22 - 5.81 MIL/uL   Hemoglobin 13.8 13.0 - 17.0 g/dL   HCT 43.5 39.0 - 52.0 %   MCV 84.3 80.0 - 100.0 fL   MCH 26.7 26.0 - 34.0 pg   MCHC 31.7 30.0 - 36.0 g/dL   RDW 15.2 11.5 - 15.5 %   Platelets 155 150 - 400 K/uL   nRBC 0.0 0.0 - 0.2 %   Neutrophils Relative % 80 %   Neutro Abs 5.7 1.7 - 7.7 K/uL   Lymphocytes Relative 8 %   Lymphs Abs 0.6 (L) 0.7 - 4.0 K/uL   Monocytes Relative 11 %   Monocytes Absolute 0.8 0.1 - 1.0 K/uL   Eosinophils Relative 0 %   Eosinophils Absolute 0.0 0.0 - 0.5 K/uL   Basophils Relative 0 %   Basophils Absolute 0.0 0.0 - 0.1 K/uL   Immature Granulocytes 1 %   Abs Immature Granulocytes 0.04 0.00 - 0.07 K/uL   Urinalysis, Routine w reflex microscopic     Status: Abnormal   Collection Time: 09/20/18  7:40 PM  Result Value Ref Range     Color, Urine YELLOW YELLOW   APPearance CLEAR CLEAR   Specific Gravity, Urine 1.028 1.005 - 1.030   pH 5.0 5.0 - 8.0   Glucose, UA NEGATIVE NEGATIVE mg/dL   Hgb urine dipstick SMALL (A) NEGATIVE   Bilirubin Urine NEGATIVE NEGATIVE   Ketones, ur NEGATIVE NEGATIVE mg/dL   Protein, ur 30 (A) NEGATIVE mg/dL   Nitrite NEGATIVE NEGATIVE   Leukocytes,Ua NEGATIVE NEGATIVE   RBC / HPF 0-5 0 - 5 RBC/hpf   WBC, UA 0-5 0 - 5 WBC/hpf   Bacteria, UA NONE SEEN NONE SEEN   Mucus PRESENT   Troponin I - ONCE - STAT     Status: None   Collection Time: 09/20/18  9:36 PM  Result Value Ref Range   Troponin I <0.03 <0.03 ng/mL  SARS Coronavirus 2 (CEPHEID - Performed in [place] hospital lab), Hosp Order     Status: None   Collection Time: 09/20/18 10:16 PM  Result Value Ref Range   SARS Coronavirus 2 NEGATIVE NEGATIVE  Glucose, capillary     Status: None   Collection Time: 09/20/18 11:35 PM  Result Value Ref Range   Glucose-Capillary 95 70 - 99 mg/dL  Basic metabolic panel     Status: Abnormal   Collection Time: 09/21/18  4:18 AM  Result Value Ref Range   Sodium 140 135 - 145 mmol/L   Potassium 3.6 3.5 - 5.1 mmol/L   Chloride 108 98 - 111 mmol/L   CO2 20 (L) 22 - 32 mmol/L   Glucose, Bld 99 70 - 99 mg/dL   BUN 22 8 - 23 mg/dL   Creatinine, Ser 1.70 (H) 0.61 - 1.24 mg/dL   Calcium 9.2 8.9 - 10.3 mg/dL   GFR calc non Af Amer 38 (L) >60 mL/min   GFR calc Af Amer 44 (L) >60 mL/min   Anion gap 12 5 - 15  CBC     Status: Abnormal   Collection Time: 09/21/18  4:18 AM  Result Value Ref Range   WBC 7.3 4.0 - 10.5 K/uL   RBC 5.06 4.22 - 5.81 MIL/uL   Hemoglobin 13.6 13.0 - 17.0 g/dL   HCT 42.3 39.0 - 52.0 %   MCV 83.6 80.0 - 100.0 fL   MCH 26.9 26.0 - 34.0 pg   MCHC 32.2 30.0 - 36.0 g/dL   RDW 15.3 11.5 - 15.5 %   Platelets 148 (L) 150 - 400 K/uL   nRBC 0.0 0.0 -  0.2 %  Troponin I - Once-Timed     Status: None   Collection Time: 09/21/18  4:18 AM  Result Value Ref Range   Troponin I <0.03 <0.03 ng/mL  Troponin I - Once     Status: None   Collection Time: 09/21/18  9:09 AM  Result Value Ref Range   Troponin I <0.03 <0.03 ng/mL   Recent Results (from the past 240 hour(s))  SARS Coronavirus 2 (CEPHEID - Performed in [place] hospital lab), Hosp Order     Status: None   Collection Time: 09/20/18 10:16 PM  Result Value Ref Range Status   SARS Coronavirus 2 NEGATIVE NEGATIVE Final    Comment: (NOTE) If result is NEGATIVE SARS-CoV-2 target nucleic acids are NOT DETECTED. The SARS-CoV-2 RNA is generally detectable in upper and lower  respiratory specimens during the acute phase of infection. The lowest  concentration of SARS-CoV-2 viral copies this assay can detect is 250  copies / mL. A negative result does not preclude SARS-CoV-2 infection    and should not be used as the sole basis for treatment or other  patient management decisions.  A negative result may occur with  improper specimen collection / handling, submission of specimen other  than nasopharyngeal swab, presence of viral mutation(s) within the  areas targeted by this assay, and inadequate number of viral copies  (<250 copies / mL). A negative result must be combined with clinical  observations, patient history, and epidemiological information. If result is POSITIVE SARS-CoV-2 target nucleic acids are DETECTED. The SARS-CoV-2 RNA is generally detectable in upper and lower  respiratory specimens dur ing the acute phase of infection.  Positive  results are indicative of active infection with SARS-CoV-2.  Clinical  correlation with patient history and other diagnostic information is  necessary to determine patient infection status.  Positive results do  not rule out bacterial infection or co-infection with other viruses. If result is PRESUMPTIVE POSTIVE SARS-CoV-2 nucleic acids MAY  BE PRESENT.   A presumptive positive result was obtained on the submitted specimen  and confirmed on repeat testing.  While 2019 novel coronavirus  (SARS-CoV-2) nucleic acids may be present in the submitted sample  additional confirmatory testing may be necessary for epidemiological  and / or clinical management purposes  to differentiate between  SARS-CoV-2 and other Sarbecovirus currently known to infect humans.  If clinically indicated additional testing with an alternate test  methodology ([id number]) is advised. The SARS-CoV-2 RNA is generally  detectable in upper and lower respiratory sp ecimens during the acute  phase of infection. The expected result is Negative. Fact Sheet for Patients:  https://www.fda.gov/media/136312/download Fact Sheet for Healthcare Providers: https://www.fda.gov/media/136313/download This test is not yet approved or cleared by the [place] FDA and has been authorized for detection and/or diagnosis of SARS-CoV-2 by FDA under an Emergency Use Authorization (EUA).  This EUA will remain in effect (meaning this test can be used) for the duration of the COVID-19 declaration under Section 564(b)(1) of the Act, 21 U.S.C. section 360bbb-3(b)(1), unless the authorization is terminated or revoked sooner. Performed at [facility], [address] [place], [place] [address]     Renal Function: Recent Labs    09/20/18 1715 09/21/18 0418  CREATININE 1.70* 1.70*   Estimated Creatinine Clearance: 45.1 mL/min (A) (by C-G formula based on SCr of 1.7 mg/dL (H)).  Radiologic Imaging: Ct Head Wo Contrast  Result Date: 09/20/2018 CLINICAL DATA:  Acute pain due to trauma EXAM: CT HEAD WITHOUT CONTRAST CT CERVICAL SPINE WITHOUT CONTRAST TECHNIQUE: Multidetector CT imaging of the head and cervical spine was performed following the standard protocol without intravenous contrast. Multiplanar CT image reconstructions of the cervical spine were also generated.  COMPARISON:  None. FINDINGS: CT HEAD FINDINGS Brain: No evidence of acute infarction, hemorrhage, hydrocephalus, extra-axial collection or mass lesion/mass effect. 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 There is significant volume loss, somewhat greater than expected for the patient's age. Vascular: No hyperdense vessel or unexpected calcification. Skull: Normal. Negative for fracture or focal lesion. Sinuses/Orbits: No acute finding. Other: None. CT CERVICAL SPINE FINDINGS Alignment: Normal. Skull base and vertebrae: No acute fracture. No primary bone lesion or focal pathologic process. Soft tissues and spinal canal: No prevertebral fluid or swelling. No visible canal hematoma. Disc levels: There is mild to moderate multilevel disc height loss throughout the cervical spine. Upper chest: Negative. Other: None IMPRESSION: 1. No acute intracranial abnormality detected. 2. No acute cervical spine fracture. 3. 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 4. Mild multilevel degenerative changes   are seen involving the cervical spine. Electronically Signed   By: Christopher  Green M.D.   On: 09/20/2018 22:06   Ct Cervical Spine Wo Contrast  Result Date: 09/20/2018 CLINICAL DATA:  Acute pain due to trauma EXAM: CT HEAD WITHOUT CONTRAST CT CERVICAL SPINE WITHOUT CONTRAST TECHNIQUE: Multidetector CT imaging of the head and cervical spine was performed following the standard protocol without intravenous contrast. Multiplanar CT image reconstructions of the cervical spine were also generated. COMPARISON:  None. FINDINGS: CT HEAD FINDINGS Brain: No evidence of acute infarction, hemorrhage, hydrocephalus, extra-axial collection or mass lesion/mass effect. 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 There is significant volume loss, somewhat greater than expected for the patient's age. Vascular: No hyperdense vessel or unexpected calcification. Skull: Normal. Negative for fracture or focal lesion.  Sinuses/Orbits: No acute finding. Other: None. CT CERVICAL SPINE FINDINGS Alignment: Normal. Skull base and vertebrae: No acute fracture. No primary bone lesion or focal pathologic process. Soft tissues and spinal canal: No prevertebral fluid or swelling. No visible canal hematoma. Disc levels: There is mild to moderate multilevel disc height loss throughout the cervical spine. Upper chest: Negative. Other: None IMPRESSION: 1. No acute intracranial abnormality detected. 2. No acute cervical spine fracture. 3. 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 4. Mild multilevel degenerative changes are seen involving the cervical spine. Electronically Signed   By: Christopher  Green M.D.   On: 09/20/2018 22:06   Dg Chest Portable 1 View  Result Date: 09/20/2018 CLINICAL DATA:  Chest pain EXAM: PORTABLE CHEST 1 VIEW COMPARISON:  CT 09/20/2018 FINDINGS: Mild cardiomegaly. No focal consolidation or effusion. Aortic atherosclerosis. No pneumothorax. IMPRESSION: No active disease.  Mild cardiomegaly. Electronically Signed   By: Kim  Fujinaga M.D.   On: 09/20/2018 22:15   Ct Renal Stone Study  Result Date: 09/20/2018 CLINICAL DATA:  77-year-old male with right flank pain since yesterday with vomiting. EXAM: CT ABDOMEN AND PELVIS WITHOUT CONTRAST TECHNIQUE: Multidetector CT imaging of the abdomen and pelvis was performed following the standard protocol without IV contrast. COMPARISON:  None available. FINDINGS: Lower chest: Noncalcified posterior pleural plaque in the right lung, posterolateral pleural plaque in the left lower lobe (series 7, image 4). Associated bilateral lung base increased reticular opacity, possibly developing fibrosis. No definite pleural fluid. No pericardial effusion. Calcified coronary artery atherosclerosis and/or stents. Hepatobiliary: Negative noncontrast liver and gallbladder. Pancreas: Negative aside from some atrophy. Spleen: Negative. Adrenals/Urinary Tract: Normal adrenal glands.  Negative noncontrast left kidney and left ureter. Unremarkable urinary bladder. The distal right ureter is normal. There is mild-to-moderate right pararenal stranding and moderate right periureteral stranding which tracks caudally in the right retroperitoneal space (series 3, image 56). There is a large oval 8 x 9 millimeter calculus in the proximal right ureter about 15 millimeters distal to the right ureteropelvic junction with hydronephrosis. There is an additional 6 millimeter right intrarenal calculus near the midpole. Stomach/Bowel: Decompressed and negative descending, rectosigmoid colon. Negative transverse colon. There is mild stranding in the right gutter but this appears related to the right pararenal space. No convincing large bowel inflammation. Negative terminal ileum. Diminutive or absent appendix. No dilated small bowel. Small fat containing hiatal hernia. Negative stomach. No free air. Vascular/Lymphatic: Vascular patency is not evaluated in the absence of IV contrast. Extensive Aortoiliac calcified atherosclerosis. 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 No lymphadenopathy. Reproductive: Small fat containing right inguinal hernia. Other: Trace pelvic free fluid ventral to the rectum on series 3,   image 79. Simple fluid density. Musculoskeletal: Osteopenia. No acute osseous abnormality identified. IMPRESSION: 1. Acute obstructive uropathy on the right with a large 8 x 9 mm proximal right ureteral calculus about 15 mm distal to the right UPJ. Possible forniceal rupture. 2. 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 Aortic Atherosclerosis (ICD10-I70.0). 3. 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 4. Small fat containing hiatal and right inguinal hernias. Electronically Signed   By: H  Hall M.D.   On: 09/20/2018 20:27    I independently  reviewed the above imaging studies.  Impression/Recommendation #1.  Right proximal ureteral calculus: Considering the size of his stone, it is unlikely to pass.  Currently, his pain is manageable with pain medication.  We therefore reviewed options for intervention including shockwave lithotripsy versus ureteroscopic laser lithotripsy.  We reviewed the pros and cons of each approach.  As his stone is outside the renal shadow on KUB imaging, he could likely proceed with shockwave lithotripsy on aspirin 81 mg but it would be recommended for him to be off Plavix for at least 5 days.  Shockwave lithotripsy would avoid a general anesthetic and could be performed under IV sedation.  However, he understands the risk that some stones may not fragment as well as others.  However, he has had good success with shockwave lithotripsy in the past apparently.  He understands that ureteroscopy would require a general anesthetic but would have a very low risk of bleeding and could potentially be performed sooner if necessary as he would not have to be off his Plavix for an extended period of time for this procedure.  Ultimately, he is most interested in proceeding with shockwave lithotripsy for treatment.  Based on the requirements for lithotripsy, since he has not seen his cardiologist in over a year, he would need cardiac clearance/risk assessment considering his history of atrial fibrillation and prior coronary artery disease before he could proceed.  If this can be performed during his hospitalization, this will help to expedite his care.  He can then resume his diet today and be administered oral pain medication.  I will reevaluate him in the morning.  If his pain is controlled, he can then be discharged home with plans to proceed with shockwave lithotripsy as an outpatient.  The lithotripsy is available on Monday and Thursdays.  Due to the fact that Monday is a holiday, the next opening for him would likely be next  Thursday.  We have reviewed the potential risks, complications, and expected recovery process associated with this procedure.  He gives informed consent to proceed if appropriate.  Les Kyra Laffey 09/21/2018, 11:56 AM    Cathren Sween S. Airika Alkhatib, Jr. MD   CC: Dr. Jessica Vann 

## 2018-09-21 NOTE — H&P (View-Only) (Signed)
Urology Consult   Physician requesting consult: Dr. Marlin Canary  Reason for consult: Right ureteral stone  History of Present Illness: Mathew Bennett is a 78 y.o. with a history of recurrent urolithiasis who has undergone various surgical interventions in the past including ureteroscopy and ESWL.  He developed the acute onset of right-sided flank pain with radiation around his right lower abdomen down to his right groin 2 days ago.  This was consistent with his prior kidney stone pain.  He presented to the emergency room last evening and underwent an evaluation including a CT scan that demonstrated an 8 to 9 mm proximal right ureteral stone.  This was radiopaque on [facility] imaging.  He was going to be discharged home with pain medication but then developed chest pain.  He does have a history of coronary artery disease and atrial fibrillation.  He is status post cardiac stent placement in the past.  He states that his cardiologist had been in [place] but had now left that town.  He states that he had not seen his cardiologist in over 1 year.  He has been maintained chronically on aspirin and Plavix due to his history of his cardiac stent.  Stent was placed many years ago although the patient cannot remember exactly when.  During his admission, he has been evaluated and ruled out for an acute infarction.  He has had 3 troponin levels that have all been negative.  Currently, he is no longer having chest pain.  His Plavix has been on hold since his admission but he did receive this medication yesterday at home.   Past Medical History:  Diagnosis Date  . Atrial fibrillation (HCC)   . Heart attack (HCC)   . Kidney stone    kidney stone  . Pneumonia     Past Surgical History:  Procedure Laterality Date  . CARDIAC CATHETERIZATION     with stent placement    Current Hospital Medications:  Home Meds:  No current facility-administered medications on file prior to encounter.    Current Outpatient  Medications on File Prior to Encounter  Medication Sig Dispense Refill  . aspirin 81 MG chewable tablet Chew 81 mg by mouth daily.    Marland Kitchen atenolol (TENORMIN) 50 MG tablet Take 25 mg by mouth daily.    Marland Kitchen atorvastatin (LIPITOR) 40 MG tablet Take 40 mg by mouth daily at 6 PM.     . clopidogrel (PLAVIX) 75 MG tablet Take 75 mg by mouth every evening.     . Esomeprazole Magnesium (NEXIUM PO) Take 1 capsule by mouth daily.    . fenofibrate 160 MG tablet Take 160 mg by mouth every evening.     . sertraline (ZOLOFT) 100 MG tablet Take 100 mg by mouth daily.    . tamsulosin (FLOMAX) 0.4 MG CAPS capsule Take 0.4 mg by mouth daily.       Scheduled Meds: . aspirin EC  81 mg Oral Daily  . tamsulosin  0.4 mg Oral QPC supper   Continuous Infusions: . sodium chloride     PRN Meds:.acetaminophen, morphine injection, nitroGLYCERIN, ondansetron (ZOFRAN) IV, oxyCODONE-acetaminophen  Allergies: No Known Allergies  History reviewed. No pertinent family history.  Social History:  reports that he has quit smoking. He has never used smokeless tobacco. No history on file for alcohol and drug.  ROS: A complete review of systems was performed.  All systems are negative except for pertinent findings as noted.  Physical Exam:  Vital signs in last 24  hours: Temp:  [97.9 F (36.6 C)-98.3 F (36.8 C)] 97.9 F (36.6 C) (05/20 1135) Pulse Rate:  [55-100] 91 (05/20 1135) Resp:  [14-23] 20 (05/20 1135) BP: (116-159)/(52-94) 145/94 (05/20 1135) SpO2:  [95 %-99 %] 95 % (05/20 1135) Weight:  [97.8 kg-100.2 kg] 97.8 kg (05/20 0007) Constitutional:  Alert and oriented, No acute distress Cardiovascular: Regular rate and rhythm, No JVD Respiratory: Normal respiratory effort, Lungs clear bilaterally GI: Soft, nondistended.  He does have right abdominal and right lower quadrant tenderness. GU: Moderate right CVA tenderness.  No left CVA tenderness. Lymphatic: No lymphadenopathy Neurologic: Grossly intact, no focal  deficits Psychiatric: Normal mood and affect  Laboratory Data:  Recent Labs    09/20/18 1715 09/21/18 0418  WBC 7.0 7.3  HGB 13.8 13.6  HCT 43.5 42.3  PLT 155 148*    Recent Labs    09/20/18 1715 09/21/18 0418  NA 139 140  K 3.6 3.6  CL 107 108  GLUCOSE 108* 99  BUN 20 22  CALCIUM 9.3 9.2  CREATININE 1.70* 1.70*     Results for orders placed or performed during the hospital encounter of 09/20/18 (from the past 24 hour(s))  Comprehensive metabolic panel     Status: Abnormal   Collection Time: 09/20/18  5:15 PM  Result Value Ref Range   Sodium 139 135 - 145 mmol/L   Potassium 3.6 3.5 - 5.1 mmol/L   Chloride 107 98 - 111 mmol/L   CO2 22 22 - 32 mmol/L   Glucose, Bld 108 (H) 70 - 99 mg/dL   BUN 20 8 - 23 mg/dL   Creatinine, Ser 1.02 (H) 0.61 - 1.24 mg/dL   Calcium 9.3 8.9 - 72.5 mg/dL   Total Protein 6.4 (L) 6.5 - 8.1 g/dL   Albumin 3.4 (L) 3.5 - 5.0 g/dL   AST 33 15 - 41 U/L   ALT 17 0 - 44 U/L   Alkaline Phosphatase 49 38 - 126 U/L   Total Bilirubin 1.3 (H) 0.3 - 1.2 mg/dL   GFR calc non Af Amer 38 (L) >60 mL/min   GFR calc Af Amer 44 (L) >60 mL/min   Anion gap 10 5 - 15  CBC with Differential     Status: Abnormal   Collection Time: 09/20/18  5:15 PM  Result Value Ref Range   WBC 7.0 4.0 - 10.5 K/uL   RBC 5.16 4.22 - 5.81 MIL/uL   Hemoglobin 13.8 13.0 - 17.0 g/dL   HCT 36.6 44.0 - 34.7 %   MCV 84.3 80.0 - 100.0 fL   MCH 26.7 26.0 - 34.0 pg   MCHC 31.7 30.0 - 36.0 g/dL   RDW 42.5 95.6 - 38.7 %   Platelets 155 150 - 400 K/uL   nRBC 0.0 0.0 - 0.2 %   Neutrophils Relative % 80 %   Neutro Abs 5.7 1.7 - 7.7 K/uL   Lymphocytes Relative 8 %   Lymphs Abs 0.6 (L) 0.7 - 4.0 K/uL   Monocytes Relative 11 %   Monocytes Absolute 0.8 0.1 - 1.0 K/uL   Eosinophils Relative 0 %   Eosinophils Absolute 0.0 0.0 - 0.5 K/uL   Basophils Relative 0 %   Basophils Absolute 0.0 0.0 - 0.1 K/uL   Immature Granulocytes 1 %   Abs Immature Granulocytes 0.04 0.00 - 0.07 K/uL   Urinalysis, Routine w reflex microscopic     Status: Abnormal   Collection Time: 09/20/18  7:40 PM  Result Value Ref Range  Color, Urine YELLOW YELLOW   APPearance CLEAR CLEAR   Specific Gravity, Urine 1.028 1.005 - 1.030   pH 5.0 5.0 - 8.0   Glucose, UA NEGATIVE NEGATIVE mg/dL   Hgb urine dipstick SMALL (A) NEGATIVE   Bilirubin Urine NEGATIVE NEGATIVE   Ketones, ur NEGATIVE NEGATIVE mg/dL   Protein, ur 30 (A) NEGATIVE mg/dL   Nitrite NEGATIVE NEGATIVE   Leukocytes,Ua NEGATIVE NEGATIVE   RBC / HPF 0-5 0 - 5 RBC/hpf   WBC, UA 0-5 0 - 5 WBC/hpf   Bacteria, UA NONE SEEN NONE SEEN   Mucus PRESENT   Troponin I - ONCE - STAT     Status: None   Collection Time: 09/20/18  9:36 PM  Result Value Ref Range   Troponin I <0.03 <0.03 ng/mL  SARS Coronavirus 2 (CEPHEID - Performed in [facility] hospital lab), Hosp Order     Status: None   Collection Time: 09/20/18 10:16 PM  Result Value Ref Range   SARS Coronavirus 2 NEGATIVE NEGATIVE  Glucose, capillary     Status: None   Collection Time: 09/20/18 11:35 PM  Result Value Ref Range   Glucose-Capillary 95 70 - 99 mg/dL  Basic metabolic panel     Status: Abnormal   Collection Time: 09/21/18  4:18 AM  Result Value Ref Range   Sodium 140 135 - 145 mmol/L   Potassium 3.6 3.5 - 5.1 mmol/L   Chloride 108 98 - 111 mmol/L   CO2 20 (L) 22 - 32 mmol/L   Glucose, Bld 99 70 - 99 mg/dL   BUN 22 8 - 23 mg/dL   Creatinine, Ser 9.60 (H) 0.61 - 1.24 mg/dL   Calcium 9.2 8.9 - 45.4 mg/dL   GFR calc non Af Amer 38 (L) >60 mL/min   GFR calc Af Amer 44 (L) >60 mL/min   Anion gap 12 5 - 15  CBC     Status: Abnormal   Collection Time: 09/21/18  4:18 AM  Result Value Ref Range   WBC 7.3 4.0 - 10.5 K/uL   RBC 5.06 4.22 - 5.81 MIL/uL   Hemoglobin 13.6 13.0 - 17.0 g/dL   HCT 09.8 11.9 - 14.7 %   MCV 83.6 80.0 - 100.0 fL   MCH 26.9 26.0 - 34.0 pg   MCHC 32.2 30.0 - 36.0 g/dL   RDW 82.9 56.2 - 13.0 %   Platelets 148 (L) 150 - 400 K/uL   nRBC 0.0 0.0 -  0.2 %  Troponin I - Once-Timed     Status: None   Collection Time: 09/21/18  4:18 AM  Result Value Ref Range   Troponin I <0.03 <0.03 ng/mL  Troponin I - Once     Status: None   Collection Time: 09/21/18  9:09 AM  Result Value Ref Range   Troponin I <0.03 <0.03 ng/mL   Recent Results (from the past 240 hour(s))  SARS Coronavirus 2 (CEPHEID - Performed in [facility] hospital lab), Hosp Order     Status: None   Collection Time: 09/20/18 10:16 PM  Result Value Ref Range Status   SARS Coronavirus 2 NEGATIVE NEGATIVE Final    Comment: (NOTE) If result is NEGATIVE SARS-CoV-2 target nucleic acids are NOT DETECTED. The SARS-CoV-2 RNA is generally detectable in upper and lower  respiratory specimens during the acute phase of infection. The lowest  concentration of SARS-CoV-2 viral copies this assay can detect is 250  copies / mL. A negative result does not preclude SARS-CoV-2 infection  and should not be used as the sole basis for treatment or other  patient management decisions.  A negative result may occur with  improper specimen collection / handling, submission of specimen other  than nasopharyngeal swab, presence of viral mutation(s) within the  areas targeted by this assay, and inadequate number of viral copies  (<250 copies / mL). A negative result must be combined with clinical  observations, patient history, and epidemiological information. If result is POSITIVE SARS-CoV-2 target nucleic acids are DETECTED. The SARS-CoV-2 RNA is generally detectable in upper and lower  respiratory specimens dur ing the acute phase of infection.  Positive  results are indicative of active infection with SARS-CoV-2.  Clinical  correlation with patient history and other diagnostic information is  necessary to determine patient infection status.  Positive results do  not rule out bacterial infection or co-infection with other viruses. If result is PRESUMPTIVE POSTIVE SARS-CoV-2 nucleic acids MAY  BE PRESENT.   A presumptive positive result was obtained on the submitted specimen  and confirmed on repeat testing.  While 2019 novel coronavirus  (SARS-CoV-2) nucleic acids may be present in the submitted sample  additional confirmatory testing may be necessary for epidemiological  and / or clinical management purposes  to differentiate between  SARS-CoV-2 and other Sarbecovirus currently known to infect humans.  If clinically indicated additional testing with an alternate test  methodology [phone]) is advised. The SARS-CoV-2 RNA is generally  detectable in upper and lower respiratory sp ecimens during the acute  phase of infection. The expected result is Negative. Fact Sheet for Patients:  BoilerBrush.com.cy Fact Sheet for Healthcare Providers: https://pope.com/ This test is not yet approved or cleared by the [place] FDA and has been authorized for detection and/or diagnosis of SARS-CoV-2 by FDA under an Emergency Use Authorization (EUA).  This EUA will remain in effect (meaning this test can be used) for the duration of the COVID-19 declaration under Section 564(b)(1) of the Act, 21 U.S.C. section 360bbb-3(b)(1), unless the authorization is terminated or revoked sooner. Performed at [facility], [address], [place], [place] [address]     Renal Function: Recent Labs    09/20/18 1715 09/21/18 0418  CREATININE 1.70* 1.70*   Estimated Creatinine Clearance: 45.1 mL/min (A) (by C-G formula based on SCr of 1.7 mg/dL (H)).  Radiologic Imaging: Ct Head Wo Contrast  Result Date: 09/20/2018 CLINICAL DATA:  Acute pain due to trauma EXAM: CT HEAD WITHOUT CONTRAST CT CERVICAL SPINE WITHOUT CONTRAST TECHNIQUE: Multidetector CT imaging of the head and cervical spine was performed following the standard protocol without intravenous contrast. Multiplanar CT image reconstructions of the cervical spine were also generated.  COMPARISON:  None. FINDINGS: CT HEAD FINDINGS Brain: No evidence of acute infarction, hemorrhage, hydrocephalus, extra-axial collection or mass lesion/mass effect. 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 There is significant volume loss, somewhat greater than expected for the patient's age. Vascular: No hyperdense vessel or unexpected calcification. Skull: Normal. Negative for fracture or focal lesion. Sinuses/Orbits: No acute finding. Other: None. CT CERVICAL SPINE FINDINGS Alignment: Normal. Skull base and vertebrae: No acute fracture. No primary bone lesion or focal pathologic process. Soft tissues and spinal canal: No prevertebral fluid or swelling. No visible canal hematoma. Disc levels: There is mild to moderate multilevel disc height loss throughout the cervical spine. Upper chest: Negative. Other: None IMPRESSION: 1. No acute intracranial abnormality detected. 2. No acute cervical spine fracture. 3. 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 4. Mild multilevel degenerative changes  are seen involving the cervical spine. Electronically Signed   By: Katherine Mantle M.D.   On: 09/20/2018 22:06   Ct Cervical Spine Wo Contrast  Result Date: 09/20/2018 CLINICAL DATA:  Acute pain due to trauma EXAM: CT HEAD WITHOUT CONTRAST CT CERVICAL SPINE WITHOUT CONTRAST TECHNIQUE: Multidetector CT imaging of the head and cervical spine was performed following the standard protocol without intravenous contrast. Multiplanar CT image reconstructions of the cervical spine were also generated. COMPARISON:  None. FINDINGS: CT HEAD FINDINGS Brain: No evidence of acute infarction, hemorrhage, hydrocephalus, extra-axial collection or mass lesion/mass effect. 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 There is significant volume loss, somewhat greater than expected for the patient's age. Vascular: No hyperdense vessel or unexpected calcification. Skull: Normal. Negative for fracture or focal lesion.  Sinuses/Orbits: No acute finding. Other: None. CT CERVICAL SPINE FINDINGS Alignment: Normal. Skull base and vertebrae: No acute fracture. No primary bone lesion or focal pathologic process. Soft tissues and spinal canal: No prevertebral fluid or swelling. No visible canal hematoma. Disc levels: There is mild to moderate multilevel disc height loss throughout the cervical spine. Upper chest: Negative. Other: None IMPRESSION: 1. No acute intracranial abnormality detected. 2. No acute cervical spine fracture. 3. 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 4. Mild multilevel degenerative changes are seen involving the cervical spine. Electronically Signed   By: Katherine Mantle M.D.   On: 09/20/2018 22:06   Dg Chest Portable 1 View  Result Date: 09/20/2018 CLINICAL DATA:  Chest pain EXAM: PORTABLE CHEST 1 VIEW COMPARISON:  CT 09/20/2018 FINDINGS: Mild cardiomegaly. No focal consolidation or effusion. Aortic atherosclerosis. No pneumothorax. IMPRESSION: No active disease.  Mild cardiomegaly. Electronically Signed   By: Jasmine Pang M.D.   On: 09/20/2018 22:15   Ct Renal Stone Study  Result Date: 09/20/2018 CLINICAL DATA:  78 year old male with right flank pain since yesterday with vomiting. EXAM: CT ABDOMEN AND PELVIS WITHOUT CONTRAST TECHNIQUE: Multidetector CT imaging of the abdomen and pelvis was performed following the standard protocol without IV contrast. COMPARISON:  None available. FINDINGS: Lower chest: Noncalcified posterior pleural plaque in the right lung, posterolateral pleural plaque in the left lower lobe (series 7, image 4). Associated bilateral lung base increased reticular opacity, possibly developing fibrosis. No definite pleural fluid. No pericardial effusion. Calcified coronary artery atherosclerosis and/or stents. Hepatobiliary: Negative noncontrast liver and gallbladder. Pancreas: Negative aside from some atrophy. Spleen: Negative. Adrenals/Urinary Tract: Normal adrenal glands.  Negative noncontrast left kidney and left ureter. Unremarkable urinary bladder. The distal right ureter is normal. There is mild-to-moderate right pararenal stranding and moderate right periureteral stranding which tracks caudally in the right retroperitoneal space (series 3, image 56). There is a large oval 8 x 9 millimeter calculus in the proximal right ureter about 15 millimeters distal to the right ureteropelvic junction with hydronephrosis. There is an additional 6 millimeter right intrarenal calculus near the midpole. Stomach/Bowel: Decompressed and negative descending, rectosigmoid colon. Negative transverse colon. There is mild stranding in the right gutter but this appears related to the right pararenal space. No convincing large bowel inflammation. Negative terminal ileum. Diminutive or absent appendix. No dilated small bowel. Small fat containing hiatal hernia. Negative stomach. No free air. Vascular/Lymphatic: Vascular patency is not evaluated in the absence of IV contrast. Extensive Aortoiliac calcified atherosclerosis. 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 No lymphadenopathy. Reproductive: Small fat containing right inguinal hernia. Other: Trace pelvic free fluid ventral to the rectum on series 3,  image 79. Simple fluid density. Musculoskeletal: Osteopenia. No acute osseous abnormality identified. IMPRESSION: 1. Acute obstructive uropathy on the right with a large 8 x 9 mm proximal right ureteral calculus about 15 mm distal to the right UPJ. Possible forniceal rupture. 2. 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 Aortic Atherosclerosis (ICD10-I70.0). 3. 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 4. Small fat containing hiatal and right inguinal hernias. Electronically Signed   By: Odessa FlemingH  Hall M.D.   On: 09/20/2018 20:27    I independently  reviewed the above imaging studies.  Impression/Recommendation #1.  Right proximal ureteral calculus: Considering the size of his stone, it is unlikely to pass.  Currently, his pain is manageable with pain medication.  We therefore reviewed options for intervention including shockwave lithotripsy versus ureteroscopic laser lithotripsy.  We reviewed the pros and cons of each approach.  As his stone is outside the renal shadow on KUB imaging, he could likely proceed with shockwave lithotripsy on aspirin 81 mg but it would be recommended for him to be off Plavix for at least 5 days.  Shockwave lithotripsy would avoid a general anesthetic and could be performed under IV sedation.  However, he understands the risk that some stones may not fragment as well as others.  However, he has had good success with shockwave lithotripsy in the past apparently.  He understands that ureteroscopy would require a general anesthetic but would have a very low risk of bleeding and could potentially be performed sooner if necessary as he would not have to be off his Plavix for an extended period of time for this procedure.  Ultimately, he is most interested in proceeding with shockwave lithotripsy for treatment.  Based on the requirements for lithotripsy, since he has not seen his cardiologist in over a year, he would need cardiac clearance/risk assessment considering his history of atrial fibrillation and prior coronary artery disease before he could proceed.  If this can be performed during his hospitalization, this will help to expedite his care.  He can then resume his diet today and be administered oral pain medication.  I will reevaluate him in the morning.  If his pain is controlled, he can then be discharged home with plans to proceed with shockwave lithotripsy as an outpatient.  The lithotripsy is available on Monday and Thursdays.  Due to the fact that Monday is a holiday, the next opening for him would likely be next  Thursday.  We have reviewed the potential risks, complications, and expected recovery process associated with this procedure.  He gives informed consent to proceed if appropriate.  Crecencio McLes Ivelisse Culverhouse 09/21/2018, 11:56 AM    Moody BruinsLester S. Auburn Hert, Jr. MD   CC: Dr. Marlin CanaryJessica Vann

## 2018-09-22 DIAGNOSIS — N201 Calculus of ureter: Secondary | ICD-10-CM

## 2018-09-22 DIAGNOSIS — I4819 Other persistent atrial fibrillation: Secondary | ICD-10-CM

## 2018-09-22 DIAGNOSIS — I25118 Atherosclerotic heart disease of native coronary artery with other forms of angina pectoris: Principal | ICD-10-CM

## 2018-09-22 LAB — BASIC METABOLIC PANEL
Anion gap: 11 (ref 5–15)
BUN: 19 mg/dL (ref 8–23)
CO2: 21 mmol/L — ABNORMAL LOW (ref 22–32)
Calcium: 8.9 mg/dL (ref 8.9–10.3)
Chloride: 105 mmol/L (ref 98–111)
Creatinine, Ser: 1.57 mg/dL — ABNORMAL HIGH (ref 0.61–1.24)
GFR calc Af Amer: 48 mL/min — ABNORMAL LOW (ref >60)
GFR calc non Af Amer: 42 mL/min — ABNORMAL LOW (ref >60)
Glucose, Bld: 90 mg/dL (ref 70–99)
Potassium: 3.7 mmol/L (ref 3.5–5.1)
Sodium: 137 mmol/L (ref 135–145)

## 2018-09-22 LAB — CBC
HCT: 43.6 % (ref 39.0–52.0)
Hemoglobin: 13.9 g/dL (ref 13.0–17.0)
MCH: 26.8 pg (ref 26.0–34.0)
MCHC: 31.9 g/dL (ref 30.0–36.0)
MCV: 84 fL (ref 80.0–100.0)
Platelets: 140 10*3/uL — ABNORMAL LOW (ref 150–400)
RBC: 5.19 MIL/uL (ref 4.22–5.81)
RDW: 15.1 % (ref 11.5–15.5)
WBC: 6.9 10*3/uL (ref 4.0–10.5)
nRBC: 0 % (ref 0.0–0.2)

## 2018-09-22 LAB — URINE CULTURE
Culture: <10000 — AB
Special Requests: NORMAL

## 2018-09-22 LAB — TROPONIN I
Troponin I: <0.03 ng/mL (ref <0.03)
Troponin I: <0.03 ng/mL (ref <0.03)
Troponin I: <0.03 ng/mL (ref <0.03)

## 2018-09-22 LAB — UREA NITROGEN, URINE: Urea Nitrogen, Ur: 891 mg/dL

## 2018-09-22 NOTE — Progress Notes (Signed)
Patient ID: Mathew Bennett, male   DOB: 1941/01/11, 78 y.o.   MRN: [id number]    Subjective: Pt doing better.  Pain is controlled with pain medication overnight.  Minimal nausea.  Echocardiogram performed yesterday.  KUB indicates his right proximal ureteral stone is outside of the renal shadow. Denies chest pain, SOB, or palpitations this morning.  Objective: Vital signs in last 24 hours: Temp:  [97 F (36.1 C)-98 F (36.7 C)] 98 F (36.7 C) (05/21 0444) Pulse Rate:  [65-105] 105 (05/21 0444) Resp:  [18-21] 18 (05/20 2347) BP: (145-163)/(74-103) 146/74 (05/21 0444) SpO2:  [92 %-100 %] 100 % (05/21 0444) Weight:  [99.4 kg] 99.4 kg (05/21 0443)  Intake/Output from previous day: 05/20 0701 - 05/21 0700 In: 1160.5 [P.O.:510; I.V.:650.5] Out: 1000 [Urine:1000] Intake/Output this shift: Total I/O In: 529.5 [I.V.:529.5] Out: 400 [Urine:400]  Physical Exam:  General: Alert and oriented Abdomen: Soft, ND, Mild R CVAT   Lab Results: Recent Labs    09/20/18 1715 09/21/18 0418 09/22/18 0411  HGB 13.8 13.6 13.9  HCT 43.5 42.3 43.6   CBC Latest Ref Rng & Units 09/22/2018 09/21/2018 09/20/2018  WBC 4.0 - 10.5 K/uL 6.9 7.3 7.0  Hemoglobin 13.0 - 17.0 g/dL 38.1 82.9 93.7  Hematocrit 39.0 - 52.0 % 43.6 42.3 43.5  Platelets 150 - 400 K/uL 140(L) 148(L) 155     BMET Recent Labs    09/21/18 0418 09/22/18 0411  NA 140 137  K 3.6 3.7  CL 108 105  CO2 20* 21*  GLUCOSE 99 90  BUN 22 19  CREATININE 1.70* 1.57*  CALCIUM 9.2 8.9     Studies/Results:   Assessment/Plan: 1) Right ureteral stone:  Renal function improved and pain now controlled.  With stone located outside the renal shadow on plain film imaging, he will be able to proceed with ESWL treatment while on ASA 81 mg but he will need to remain off Plavix.  If he receives cardiology clearance to proceed with procedure, he can be discharged home with oral pain medication and I will plan to get him on the schedule next week.   Lithotripsy mobile unit is on site Monday and Thursdays but due to [facility], next available date would be Thursday next week.  Will tentatively schedule him pending final cardiology approval to proceed.     LOS: 1 day   Crecencio Mc 09/22/2018, 6:47 AM

## 2018-09-22 NOTE — Progress Notes (Signed)
[facility] PROGRESS NOTE  Mathew ChessBilly J Bennett ZOX:[id number] DOB: 08-19-1940 DOA: 09/20/2018 PCP: Larene Pickettondit, Bridget F, FNP  Assessment/Plan: 1.Chest pain; CAD. Experienced chest pain this am with ambulation. Reports intermittent chest pain with stairs and chores over last several months. He presented with 3 days of right flank pain and then acute-onset of chest pain with nausea and diaphoresis just prior to arrivalTroponin negative x3,  CXR without acute findings, and EKG with a fib, PVC, and RBBB no acute changes. Patient reports hx of CAD with stents. Of note efforts to obtain medical records have been fruitless. [facility]. Patient not seen in over 4 years. Home meds include plavix and asa. Received asa as well. Echo with EF 60%.  Will need cardiac clearance for scheduled lithotripsy and given chest pain with exertion will: -cycle troponin -serial ekg -request cards consult.  2.Right ureteral obstructing stone/hydronephrosis Pain managed with oral meds. Patient with obstructing right proximal ureteral stone measuring 8 x 9 mm with possible forniceal rupture.. Continues with flank pain radiating to lower abdomen. No fever, nausea. Decreased oral intake. Urology recommending cardiac clearance and lithotripsy next week. -continue gentle IV fluids -monitor  3.Renal insufficiencySCr is stable trending down to 1.5. No prior labs for comparisonRight-sided ureteral obstruction noted on CT as aboveUnclear chronicity, possibly some acute prerenal injury component from N/V. Urine sodium and urine creatinine within limits of normal.  -gentle IV fluids -hold nephrotoxins -bmet in am  4.Atrial fibrillationHR high end of normal.  CHADS-VASc is at least 663 (age x2, CAD)Home meds include Plavix, atenolol -holding plavix -continue atenolol -see #1   Code Status: full Family Communication: wife on phone. She and patient have very little if any  support. Will need local SW and HH support. [facility] recommendation [facility] Disposition Plan: home when ready   Consultants:  [facility]  [facility]  Procedures:    Antibiotics:    HPI/Subjective: 78 yo cad s/p stent on plavix and asa, afib no anticoagulation admitted with obstructing ureter calculus and hydronephrosis as well as chest pain with exertion. Ruled out initially and then developed cp with ambulation. Cards consult requested  Objective: Vitals:   09/22/18 0444 09/22/18 0814  BP: (!) 146/74 (!) 144/83  Pulse: (!) 105 94  Resp:    Temp: 98 F (36.7 C)   SpO2: 100% 97%    Intake/Output Summary (Last 24 hours) at 09/22/2018 0945 Last data filed at 09/22/2018 0810 Gross per 24 hour  Intake 1400.5 ml  Output 900 ml  Net 500.5 ml   Filed Weights   09/20/18 1648 09/21/18 0007 09/22/18 0443  Weight: 100.2 kg 97.8 kg 99.4 kg    Exam:   General:  Awake alert sitting in chair in no acute distress  Cardiovascular: irregularly irregular no mgr no LE edema  Respiratory: mild increase work of breathing with conversation. BS clear bilaterally no wheeze/rhonchi  Abdomen: non-distended +BS non-tender to palpation. No guarding or rebounding.   Musculoskeletal: joints without swelling/erythema   Data Reviewed: Basic Metabolic Panel: Recent Labs  Lab 09/20/18 1715 09/21/18 0418 09/22/18 0411  NA 139 140 137  K 3.6 3.6 3.7  CL 107 108 105  CO2 22 20* 21*  GLUCOSE 108* 99 90  BUN 20 22 19   CREATININE 1.70* 1.70* 1.57*  CALCIUM 9.3 9.2 8.9   Liver Function Tests: Recent Labs  Lab 09/20/18 1715  AST 33  ALT 17  ALKPHOS 49  BILITOT 1.3*  PROT 6.4*  ALBUMIN 3.4*   No results for input(s): LIPASE, AMYLASE in the last 168 hours. No results for input(s): AMMONIA in the last 168 hours. CBC: Recent Labs  Lab 09/20/18 1715 09/21/18 0418 09/22/18 0411  WBC 7.0 7.3 6.9  NEUTROABS 5.7  --   --   HGB 13.8 13.6  13.9  HCT 43.5 42.3 43.6  MCV 84.3 83.6 84.0  PLT 155 148* 140*   Cardiac Enzymes: Recent Labs  Lab 09/20/18 2136 09/21/18 0418 09/21/18 0909  TROPONINI <0.03 <0.03 <0.03   BNP (last 3 results) No results for input(s): BNP in the last 8760 hours.  ProBNP (last 3 results) No results for input(s): PROBNP in the last 8760 hours.  CBG: Recent Labs  Lab 09/20/18 2335  GLUCAP 95    Recent Results (from the past 240 hour(s))  Urine culture     Status: Abnormal   Collection Time: 09/20/18  7:42 PM  Result Value Ref Range Status   Specimen Description URINE, CLEAN CATCH  Final   Special Requests Normal  Final   Culture (A)  Final    <10,000 COLONIES/mL INSIGNIFICANT GROWTH Performed at [facility], [address] [place], [place] [address]    Report Status 09/22/2018 FINAL  Final  SARS Coronavirus 2 (CEPHEID - Performed in [facility] hospital lab), Hosp Order     Status: None   Collection Time: 09/20/18 10:16 PM  Result Value Ref Range Status   SARS Coronavirus 2 NEGATIVE NEGATIVE Final    Comment: (NOTE) If result is NEGATIVE SARS-CoV-2 target nucleic acids are NOT DETECTED. The SARS-CoV-2 RNA is generally detectable in upper and lower  respiratory specimens during the acute phase of infection. The lowest  concentration of SARS-CoV-2 viral copies this assay can detect is 250  copies / mL. A negative result does not preclude SARS-CoV-2 infection  and should not be used as the sole basis for treatment or other  patient management decisions.  A negative result may occur with  improper specimen collection / handling, submission of specimen other  than nasopharyngeal swab, presence of viral mutation(s) within the  areas targeted by this assay, and inadequate number of viral copies  (<250 copies / mL). A negative result must be combined with clinical  observations, patient history, and epidemiological information. If result is POSITIVE SARS-CoV-2 target nucleic  acids are DETECTED. The SARS-CoV-2 RNA is generally detectable in upper and lower  respiratory specimens dur ing the acute phase of infection.  Positive  results are indicative of active infection with SARS-CoV-2.  Clinical  correlation with patient history and other diagnostic information is  necessary to determine patient infection status.  Positive results do  not rule out bacterial infection or co-infection with other viruses. If result is PRESUMPTIVE POSTIVE SARS-CoV-2 nucleic acids MAY BE PRESENT.   A presumptive positive result was obtained on the submitted specimen  and confirmed on repeat testing.  While 2019 novel coronavirus  (SARS-CoV-2) nucleic acids may be present in the submitted sample  additional confirmatory testing may be necessary for epidemiological  and / or clinical management purposes  to differentiate between  SARS-CoV-2 and other Sarbecovirus currently known to infect humans.  If clinically indicated additional testing with an alternate test  methodology [phone]) is advised. The SARS-CoV-2 RNA is generally  detectable in upper and lower respiratory sp ecimens during the acute  phase of infection. The expected result is Negative. Fact Sheet for Patients:  BoilerBrush.com.cy Fact  Sheet for Healthcare Providers: https://pope.com/ This test is not yet approved or cleared by the [place] and has been authorized for detection and/or diagnosis of SARS-CoV-2 by FDA under an Emergency Use Authorization (EUA).  This EUA will remain in effect (meaning this test can be used) for the duration of the COVID-19 declaration under Section 564(b)(1) of the Act, 21 U.S.C. section 360bbb-3(b)(1), unless the authorization is terminated or revoked sooner. Performed at [facility], [address], [place], [place] [address]      Studies: Dg Abd 1 View  Result Date: 09/21/2018 CLINICAL DATA:  Abdominal pain.  EXAM: ABDOMEN - 1 VIEW COMPARISON:  CT scan of Sep 20, 2018. FINDINGS: The bowel gas pattern is normal. Right renal calculus is noted. Right ureteral calculus is noted to the right of L3-4 disc space. IMPRESSION: Right renal and ureteral calculi are noted as described above. No evidence of bowel obstruction or ileus. Electronically Signed   By: Lupita Raider M.D.   On: 09/21/2018 14:23   Ct Head Wo Contrast  Result Date: 09/20/2018 CLINICAL DATA:  Acute pain due to trauma EXAM: CT HEAD WITHOUT CONTRAST CT CERVICAL SPINE WITHOUT CONTRAST TECHNIQUE: Multidetector CT imaging of the head and cervical spine was performed following the standard protocol without intravenous contrast. Multiplanar CT image reconstructions of the cervical spine were also generated. COMPARISON:  None. FINDINGS: CT HEAD FINDINGS Brain: No evidence of acute infarction, hemorrhage, hydrocephalus, extra-axial collection or mass lesion/mass effect. 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 There is significant volume loss, somewhat greater than expected for the patient's age. Vascular: No hyperdense vessel or unexpected calcification. Skull: Normal. Negative for fracture or focal lesion. Sinuses/Orbits: No acute finding. Other: None. CT CERVICAL SPINE FINDINGS Alignment: Normal. Skull base and vertebrae: No acute fracture. No primary bone lesion or focal pathologic process. Soft tissues and spinal canal: No prevertebral fluid or swelling. No visible canal hematoma. Disc levels: There is mild to moderate multilevel disc height loss throughout the cervical spine. Upper chest: Negative. Other: None IMPRESSION: 1. No acute intracranial abnormality detected. 2. No acute cervical spine fracture. 3. 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 4. Mild multilevel degenerative changes are seen involving the cervical spine. Electronically Signed   By: Katherine Mantle M.D.   On: 09/20/2018 22:06   Ct Cervical Spine Wo  Contrast  Result Date: 09/20/2018 CLINICAL DATA:  Acute pain due to trauma EXAM: CT HEAD WITHOUT CONTRAST CT CERVICAL SPINE WITHOUT CONTRAST TECHNIQUE: Multidetector CT imaging of the head and cervical spine was performed following the standard protocol without intravenous contrast. Multiplanar CT image reconstructions of the cervical spine were also generated. COMPARISON:  None. FINDINGS: CT HEAD FINDINGS Brain: No evidence of acute infarction, hemorrhage, hydrocephalus, extra-axial collection or mass lesion/mass effect. 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 There is significant volume loss, somewhat greater than expected for the patient's age. Vascular: No hyperdense vessel or unexpected calcification. Skull: Normal. Negative for fracture or focal lesion. Sinuses/Orbits: No acute finding. Other: None. CT CERVICAL SPINE FINDINGS Alignment: Normal. Skull base and vertebrae: No acute fracture. No primary bone lesion or focal pathologic process. Soft tissues and spinal canal: No prevertebral fluid or swelling. No visible canal hematoma. Disc levels: There is mild to moderate multilevel disc height loss throughout the cervical spine. Upper chest: Negative. Other: None IMPRESSION: 1. No acute intracranial abnormality detected. 2. No acute cervical spine fracture. 3. 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 4. Mild multilevel degenerative changes are seen involving  the cervical spine. Electronically Signed   By: Katherine Mantle M.D.   On: 09/20/2018 22:06   Dg Chest Portable 1 View  Result Date: 09/20/2018 CLINICAL DATA:  Chest pain EXAM: PORTABLE CHEST 1 VIEW COMPARISON:  CT 09/20/2018 FINDINGS: Mild cardiomegaly. No focal consolidation or effusion. Aortic atherosclerosis. No pneumothorax. IMPRESSION: No active disease.  Mild cardiomegaly. Electronically Signed   By: Jasmine Pang M.D.   On: 09/20/2018 22:15   Ct Renal Stone Study  Result Date: 09/20/2018 CLINICAL DATA:  78 year old  male with right flank pain since yesterday with vomiting. EXAM: CT ABDOMEN AND PELVIS WITHOUT CONTRAST TECHNIQUE: Multidetector CT imaging of the abdomen and pelvis was performed following the standard protocol without IV contrast. COMPARISON:  None available. FINDINGS: Lower chest: Noncalcified posterior pleural plaque in the right lung, posterolateral pleural plaque in the left lower lobe (series 7, image 4). Associated bilateral lung base increased reticular opacity, possibly developing fibrosis. No definite pleural fluid. No pericardial effusion. Calcified coronary artery atherosclerosis and/or stents. Hepatobiliary: Negative noncontrast liver and gallbladder. Pancreas: Negative aside from some atrophy. Spleen: Negative. Adrenals/Urinary Tract: Normal adrenal glands. Negative noncontrast left kidney and left ureter. Unremarkable urinary bladder. The distal right ureter is normal. There is mild-to-moderate right pararenal stranding and moderate right periureteral stranding which tracks caudally in the right retroperitoneal space (series 3, image 56). There is a large oval 8 x 9 millimeter calculus in the proximal right ureter about 15 millimeters distal to the right ureteropelvic junction with hydronephrosis. There is an additional 6 millimeter right intrarenal calculus near the midpole. Stomach/Bowel: Decompressed and negative descending, rectosigmoid colon. Negative transverse colon. There is mild stranding in the right gutter but this appears related to the right pararenal space. No convincing large bowel inflammation. Negative terminal ileum. Diminutive or absent appendix. No dilated small bowel. Small fat containing hiatal hernia. Negative stomach. No free air. Vascular/Lymphatic: Vascular patency is not evaluated in the absence of IV contrast. Extensive Aortoiliac calcified atherosclerosis. 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 No  lymphadenopathy. Reproductive: Small fat containing right inguinal hernia. Other: Trace pelvic free fluid ventral to the rectum on series 3, image 79. Simple fluid density. Musculoskeletal: Osteopenia. No acute osseous abnormality identified. IMPRESSION: 1. Acute obstructive uropathy on the right with a large 8 x 9 mm proximal right ureteral calculus about 15 mm distal to the right UPJ. Possible forniceal rupture. 2. 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 Aortic Atherosclerosis (ICD10-I70.0). 3. 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 4. Small fat containing hiatal and right inguinal hernias. Electronically Signed   By: Odessa Fleming M.D.   On: 09/20/2018 20:27    Scheduled Meds: . aspirin EC  81 mg Oral Daily  . atenolol  25 mg Oral Daily  . tamsulosin  0.4 mg Oral QPC supper   Continuous Infusions: . sodium chloride 50 mL/hr at 09/22/18 3016    Active Problems:   Acute unilateral obstructive uropathy   Ureteral calculus   Hydronephrosis   Chest pain   Renal insufficiency   Atrial fibrillation (HCC)    Time spent: 36    [facility] M NP  [facility]  If 7PM-7AM, please contact night-coverage at [email or web address], password [facility] 09/22/2018, 9:45 AM  LOS: 1 day

## 2018-09-22 NOTE — Consult Note (Signed)
Cardiology Consult    Patient ID: [name] MRN: [id number], DOB/AGE: April 08, 1941   Admit date: 09/20/2018 Date of Consult: 09/22/2018  Primary Physician: Mathew Pickett, FNP Primary Cardiologist: New to [facility] (Dr. Royann Bennett) Requesting Provider: Marlin Canary, DO  Patient Profile    Mathew Bennett is a 78 y.o. male with a history of CAD s/p stenting several years ago, atrial fibrillation not on anticoagulation, and nephrolithiasis, who presented with right flank pain x3 days and acute onset of chest pain and was found to have a right ureteral obstructing stone. Plan is for extracorpal shock wave lithotripsy treatment next Thursday. Cardiology was consulted for per-operative evaluation at the request of at the request of Dr. Benjamine Bennett (Internal Medicine) and Dr. Laverle Bennett (Urology).  History of Present Illness    Mathew Bennett is a 78 year old male with the above history who was previously seen by [facility]. Patient presented to the ED via EMS on 09/20/2018 for evaluation of severe right flank pain and sudden onset of chest pain. Patient was found to have an acute obstructive uropathy of the right with a large 8x9 proximal right ureteral calculus. Patient was admitted for further evaluation and management. Urology was consulted and is planning for lithotripsy treatment next Thursday. Cardiology was consulted for pre-operative evaluation.  In an effort to minimize exposure given current COVID-19 pandemic, I attempt to call into the patient's room to obtain a history. However, I was unable to reach the patient. Spoke with RN who said patient was very hard of hearing and a difficult historian due to his mental status. She reports that he has denied chest pain to her but has reported chest pain to other providers. She advised me to call his wife for additional history.  Called and spoke with patient's wife Mathew Bennett who reported the following history: Patient has had a change in his mental  status over the past couple of month. Wife states patient has been very confused and thinks he may have Parkinson's because his mouth "jumps up and down" and he has a lot of balance issues and falls frequently. Patient initially had an appointment to see a Neurologist but this has been postponed due to the coronavirus. Wife states patient had stents placed over 5 years ago and states he has not seen his Cardiologist in probably 2 years. I am unable to see the cath report in Care Everywhere; however, I can see that he had a Myoview in 12/2012 which showed no evidence of ischemia. Wife reports this was after he had the stents placed. It appears that patient is still un dual antiplatelet therapy with Aspirin and Plavix but wife is unsure why he is still taking this. He has been told that he has an irregular heart rhythm during previous hospitalizations at other hospitals but is unsure if it was specifically atrial fibrillation. It does not appear that he is on any anticoagulation at home. Patient is not very active and is mostly sedentary due to his balance issues. He has not complained of any chest pain recently prior to the incident leading to this hospitalization. However, he does have dyspnea with little exertion such as walking to his mailbox. Wife states he rides his lawnmower to the mailbox because he gets so short of breath walking. Wife is unaware of any orthopnea, PND, or edema.   Wife reports patient started complaining of severe right flank pain 3 night ago on 09/19/2018 with nausea and multiple of episodes of vomiting throughout  the night. He woke up the following day with persistent flank pain and then developed sudden on set of chest pain. Wife states patient said, "I am having a heart attack" because the pain was so bad. The chest pain improved a little when patient with rest but then another wave of pain came on so patient called 911. In the ED, EKG reportedly showed atrial fibrillation with controlled  rate (unable to personally review tracings remotely). Troponin has remained negative.  Patient is a former smoker but quit about 25 years ago. Patient's wife thinks patient's siblings have a history of heart disease.   Of note, patient's wife report 2 episodes of hematuria over the last couple of months that eventually resolved on its own. Wife tried to get him to go to the doctor but patient refused.   Past Medical History   Past Medical History:  Diagnosis Date   Atrial fibrillation (HCC)    Heart attack (HCC)    Kidney stone    kidney stone   Pneumonia     Past Surgical History:  Procedure Laterality Date   CARDIAC CATHETERIZATION     with stent placement     Allergies  No Known Allergies  Inpatient Medications     aspirin EC  81 mg Oral Daily   atenolol  25 mg Oral Daily   tamsulosin  0.4 mg Oral QPC supper    Family History    History reviewed. No pertinent family history. has no family status information on file.    Social History    Social History   Socioeconomic History   Marital status: Single    Spouse name: Not on file   Number of children: Not on file   Years of education: Not on file   Highest education level: Not on file  Occupational History   Not on file  Social Needs   Financial resource strain: Not on file   Food insecurity:    Worry: Not on file    Inability: Not on file   Transportation needs:    Medical: Not on file    Non-medical: Not on file  Tobacco Use   Smoking status: Former Smoker   Smokeless tobacco: Never Used  Substance and Sexual Activity   Alcohol use: Not on file   Drug use: Not on file   Sexual activity: Yes  Lifestyle   Physical activity:    Days per week: Not on file    Minutes per session: Not on file   Stress: Not on file  Relationships   Social connections:    Talks on phone: Not on file    Gets together: Not on file    Attends religious service: Not on file    Active member of  club or organization: Not on file    Attends meetings of clubs or organizations: Not on file    Relationship status: Not on file   Intimate partner violence:    Fear of current or ex partner: Not on file    Emotionally abused: Not on file    Physically abused: Not on file    Forced sexual activity: Not on file  Other Topics Concern   Not on file  Social History Narrative   Retired tobacco farmer from [place], enjoys outdoors.      Review of Systems    Please see HPI. Unable to call and obtain ROS from patient due to patient's mental status and the fact that he is very hard of  hearing. Was able to obtain some ROS from wife.  Physical Exam    Blood pressure (!) 144/83, pulse 94, temperature 98 F (36.7 C), resp. rate 18, height  (1.956 m), weight 99.4 kg, SpO2 97 %.   Physical Exam per MD.  Labs    Troponin [facility] of Care Test) No results for input(s): TROPIPOC in the last 72 hours. Recent Labs    09/20/18 2136 09/21/18 0418 09/21/18 0909 09/22/18 0933  TROPONINI <0.03 <0.03 <0.03 <0.03   Lab Results  Component Value Date   WBC 6.9 09/22/2018   HGB 13.9 09/22/2018   HCT 43.6 09/22/2018   MCV 84.0 09/22/2018   PLT 140 (L) 09/22/2018    Recent Labs  Lab 09/20/18 1715  09/22/18 0411  NA 139   < > 137  K 3.6   < > 3.7  CL 107   < > 105  CO2 22   < > 21*  BUN 20   < > 19  CREATININE 1.70*   < > 1.57*  CALCIUM 9.3   < > 8.9  PROT 6.4*  --   --   BILITOT 1.3*  --   --   ALKPHOS 49  --   --   ALT 17  --   --   AST 33  --   --   GLUCOSE 108*   < > 90   < > = values in this interval not displayed.   No results found for: CHOL, HDL, LDLCALC, TRIG No results found for: [facility]   Radiology Studies    Dg Abd 1 View  Result Date: 09/21/2018 CLINICAL DATA:  Abdominal pain. EXAM: ABDOMEN - 1 VIEW COMPARISON:  CT scan of Sep 20, 2018. FINDINGS: The bowel gas pattern is normal. Right renal calculus is noted. Right ureteral calculus is noted to the right of L3-4  disc space. IMPRESSION: Right renal and ureteral calculi are noted as described above. No evidence of bowel obstruction or ileus. Electronically Signed   By: Lupita Raider M.D.   On: 09/21/2018 14:23   Ct Head Wo Contrast  Result Date: 09/20/2018 CLINICAL DATA:  Acute pain due to trauma EXAM: CT HEAD WITHOUT CONTRAST CT CERVICAL SPINE WITHOUT CONTRAST TECHNIQUE: Multidetector CT imaging of the head and cervical spine was performed following the standard protocol without intravenous contrast. Multiplanar CT image reconstructions of the cervical spine were also generated. COMPARISON:  None. FINDINGS: CT HEAD FINDINGS Brain: No evidence of acute infarction, hemorrhage, hydrocephalus, extra-axial collection or mass lesion/mass effect. 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 There is significant volume loss, somewhat greater than expected for the patient's age. Vascular: No hyperdense vessel or unexpected calcification. Skull: Normal. Negative for fracture or focal lesion. Sinuses/Orbits: No acute finding. Other: None. CT CERVICAL SPINE FINDINGS Alignment: Normal. Skull base and vertebrae: No acute fracture. No primary bone lesion or focal pathologic process. Soft tissues and spinal canal: No prevertebral fluid or swelling. No visible canal hematoma. Disc levels: There is mild to moderate multilevel disc height loss throughout the cervical spine. Upper chest: Negative. Other: None IMPRESSION: 1. No acute intracranial abnormality detected. 2. No acute cervical spine fracture. 3. 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 4. Mild multilevel degenerative changes are seen involving the cervical spine. Electronically Signed   By: Katherine Mantle M.D.   On: 09/20/2018 22:06   Ct Cervical Spine Wo Contrast  Result Date: 09/20/2018 CLINICAL DATA:  Acute pain due to trauma EXAM: CT HEAD WITHOUT CONTRAST  CT CERVICAL SPINE WITHOUT CONTRAST TECHNIQUE: Multidetector CT imaging of the head and cervical  spine was performed following the standard protocol without intravenous contrast. Multiplanar CT image reconstructions of the cervical spine were also generated. COMPARISON:  None. FINDINGS: CT HEAD FINDINGS Brain: No evidence of acute infarction, hemorrhage, hydrocephalus, extra-axial collection or mass lesion/mass effect. 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 There is significant volume loss, somewhat greater than expected for the patient's age. Vascular: No hyperdense vessel or unexpected calcification. Skull: Normal. Negative for fracture or focal lesion. Sinuses/Orbits: No acute finding. Other: None. CT CERVICAL SPINE FINDINGS Alignment: Normal. Skull base and vertebrae: No acute fracture. No primary bone lesion or focal pathologic process. Soft tissues and spinal canal: No prevertebral fluid or swelling. No visible canal hematoma. Disc levels: There is mild to moderate multilevel disc height loss throughout the cervical spine. Upper chest: Negative. Other: None IMPRESSION: 1. No acute intracranial abnormality detected. 2. No acute cervical spine fracture. 3. 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 4. Mild multilevel degenerative changes are seen involving the cervical spine. Electronically Signed   By: Katherine Mantle M.D.   On: 09/20/2018 22:06   Dg Chest Portable 1 View  Result Date: 09/20/2018 CLINICAL DATA:  Chest pain EXAM: PORTABLE CHEST 1 VIEW COMPARISON:  CT 09/20/2018 FINDINGS: Mild cardiomegaly. No focal consolidation or effusion. Aortic atherosclerosis. No pneumothorax. IMPRESSION: No active disease.  Mild cardiomegaly. Electronically Signed   By: Jasmine Pang M.D.   On: 09/20/2018 22:15   Ct Renal Stone Study  Result Date: 09/20/2018 CLINICAL DATA:  78 year old male with right flank pain since yesterday with vomiting. EXAM: CT ABDOMEN AND PELVIS WITHOUT CONTRAST TECHNIQUE: Multidetector CT imaging of the abdomen and pelvis was performed following the standard  protocol without IV contrast. COMPARISON:  None available. FINDINGS: Lower chest: Noncalcified posterior pleural plaque in the right lung, posterolateral pleural plaque in the left lower lobe (series 7, image 4). Associated bilateral lung base increased reticular opacity, possibly developing fibrosis. No definite pleural fluid. No pericardial effusion. Calcified coronary artery atherosclerosis and/or stents. Hepatobiliary: Negative noncontrast liver and gallbladder. Pancreas: Negative aside from some atrophy. Spleen: Negative. Adrenals/Urinary Tract: Normal adrenal glands. Negative noncontrast left kidney and left ureter. Unremarkable urinary bladder. The distal right ureter is normal. There is mild-to-moderate right pararenal stranding and moderate right periureteral stranding which tracks caudally in the right retroperitoneal space (series 3, image 56). There is a large oval 8 x 9 millimeter calculus in the proximal right ureter about 15 millimeters distal to the right ureteropelvic junction with hydronephrosis. There is an additional 6 millimeter right intrarenal calculus near the midpole. Stomach/Bowel: Decompressed and negative descending, rectosigmoid colon. Negative transverse colon. There is mild stranding in the right gutter but this appears related to the right pararenal space. No convincing large bowel inflammation. Negative terminal ileum. Diminutive or absent appendix. No dilated small bowel. Small fat containing hiatal hernia. Negative stomach. No free air. Vascular/Lymphatic: Vascular patency is not evaluated in the absence of IV contrast. Extensive Aortoiliac calcified atherosclerosis. 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 No lymphadenopathy. Reproductive: Small fat containing right inguinal hernia. Other: Trace pelvic free fluid ventral to the rectum on series 3, image 79. Simple fluid density. Musculoskeletal: Osteopenia. No acute  osseous abnormality identified. IMPRESSION: 1. Acute obstructive uropathy on the right with a large 8 x 9 mm proximal right ureteral calculus about 15 mm distal to the right UPJ. Possible forniceal rupture. 2.  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 Aortic Atherosclerosis (ICD10-I70.0). 3. 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 4. Small fat containing hiatal and right inguinal hernias. Electronically Signed   By: Odessa Fleming M.D.   On: 09/20/2018 20:27    EKG     EKG: EKG was personally reviewed and demonstrates: atrial fibrillation, controlled rate, RBBB Telemetry: Telemetry was personally reviewed and demonstrates: atrial fibrillation, rate controlled  Cardiac Imaging    Echocardiogram 09/21/2018: 1. The left ventricle has normal systolic function, with an ejection fraction of 55-60%. The cavity size was normal. There is mild concentric left ventricular hypertrophy. Left ventricular diastolic Doppler parameters are indeterminate secondary to  atrial fibrillation. Elevated mean left atrial pressure.  2. The right ventricle has normal systolic function. The cavity was normal. There is no increase in right ventricular wall thickness.  3. Left atrial size was mild-moderately dilated.  4. Right atrial size was mildly dilated.  5. No evidence of mitral valve stenosis.  6. The aortic valve is tricuspid. Aortic valve regurgitation is mild by color flow Doppler.  7. The inferior vena cava was dilated in size with <50% respiratory variability. _______________  Celine Ahr 12/07/2012 ([facility]): Impression: 1. Negative electrocardiographic response to stress with pharmacologic augmentation. 2. Normal myocardial perfusion scan. No evidence of ischemia. 3. Normal left ventricular systolic function wall motion.  This represents a low risk study.  Assessment & Plan    Pre-Operative  Evaluation - Patient presented with severe right flank pain and sudden onset of chest pain after multiple episodes of vomiting. He was found to have a large right ureteral stone. Urology was consulted. Plan is for extracorpal shock wave lithotripsy treatment next Thursday. Cardiology was consulted for pre-operative evaluation given history of CAD s/p stenting and atrial fibrillation - Patient previously followed at [facility] and reportedly had stents placed over 6 years. Cannot see documentation of this in Care Everywhere. Myoview in Care Everywhere from 12/2012 showed no evidence of ischemia (wife reports this was after stenting). Patient has not seen Cardiologist in 2 years. - Echo this admission showed LVEF of 55-60. - Patient very sedentary due to balance issues but wife reports his has dyspnea with minimal exertion such as walking to mail box.  - Per Revised Cardiac Risk Index, considered low risk surgery with 0.9% risk of cardiac complications. However, unable to truly assess functional status and with dyspnea on exertion may consider Myoview prior to procedure. Will discuss with MD.  CAD - Patient has a history of CAD s/p stenting over 6 years ago. Last Myoview in 2014 showed no ischemia. - Patient did present with acute onset of chest pain after multiple episodes of vomiting. No recent chest pain prior to this.  - Troponin negative x4. - Currently still on dual antiplatelet therapy with Aspirin and Plavix (though wife is unsure why). - Continue home Atenolol and Lipitor. - Suspect chest pain may be secondary to esophageal irritation due to vomiting. Has ruled out for ACS. However, may benefit from Myoview prior to procedure as stated above.  Atrial Fibrillation - Wife reports patient has reportedly been told of any irregular heart rhythm before but unsure if this was specifically atrial fibrillation. - Telemetry shows atrial fibrillation, controlled rate - Continue home Atenolol   daily. - CHA2DS2-VASc = at least 3 (CAD, age x2). Patient not on any anticoagulation at home. Patient has a lot of balance issues  and reportedly falls frequently so I may be hesitant to start long-term anticoagulation. Will defer to MD.  Renal Insufficiency - Serum creatinine 1.70 on admission and 1.57 today. Baseline unknown.  - Continue to monitor.  Otherwise, per primary team.  Signed, Corrin Parkerallie E Goodrich, PA-C 09/22/2018, 12:11 PM Pager: [phone] For questions or updates, please contact   Please consult [email or web address] for contact info under Cardiology/STEMI.  I have seen and examined the patient along with Corrin Parkerallie E Goodrich, PA-C.  I have reviewed the chart, notes and new data.  I agree with PA's note.  Key new complaints: his major functional limitation is dizziness.unsteady gait, which limits his activity level. 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 He is scheduled for a procedure with low risk of CV complications. I believe he should have the lithotripsy first, before any additional CV evaluation, with low risk of complications. After lithotripsy, he should undergo a Lexiscan Myoview and if there is an extensive area of ischemia and his renal function is stable, then we can consider coronary angiography and PCI (doing PCI before he has the lithotripsy would delay taking care of his ureteral obstruction for at least 6 months). I would definitely not start anticoagulants before the lithotripsy and he is at best a marginal candidate for long term anticoagulation due to instability and falls.   Thurmon FairMihai Gustavia Carie, MD,  [facility] Hospital[facility]ACC [facility] [phone] 09/22/2018, 4:22 PM

## 2018-09-22 NOTE — Evaluation (Signed)
Physical Therapy Evaluation Patient Details Name: Mathew Bennett MRN: [id number] DOB: 10-Oct-1940 Today's Date: 09/22/2018   History of Present Illness  78 yo with PMHx: CAD, nephrolithiasis, and atrial fibrillation, admitted with right flank pain with Rt uretral stone pt had acute CP with negative enzymes  Clinical Impression  Pt pleasant but HOH and demonstrates cognitive deficits with limited awareness, problem solving and memory. Pt with decreased strength, transfers, gait and functional mobility with history of multiple falls who would benefit from acute therapy to maximize mobility, safety and strength to decrease burden of care. Pt educated for need to use RW at all times and will continue to work toward improved balance and stair training.   HR 100-131 with activity     Follow Up Recommendations Home health PT;Supervision/Assistance - 24 hour    Equipment Recommendations  Rolling walker with 5" wheels    Recommendations for Other Services OT consult     Precautions / Restrictions Precautions Precautions: Fall Restrictions Weight Bearing Restrictions: No      Mobility  Bed Mobility Overal bed mobility: Needs Assistance Bed Mobility: Supine to Sit     Supine to sit: Min guard;HOB elevated     General bed mobility comments: HOb 20 degrees with use of rail, increased time  Transfers Overall transfer level: Needs assistance   Transfers: Sit to/from Stand Sit to Stand: Min guard         General transfer comment: guarding for safety, pt with initial sway with standing   Ambulation/Gait Ambulation/Gait assistance: Min guard Gait Distance (Feet): 350 Feet Assistive device: Rolling walker (2 wheeled) Gait Pattern/deviations: Step-through pattern;Decreased stride length   Gait velocity interpretation: >2.62 ft/sec, indicative of community ambulatory General Gait Details: pt initiated gait without RW with immediate need to reach for external support. Utilized RW  throughout gait with cues for posture and position in RW as well as safety to avoid obstacles, pt with decreased awareness of safe use of RW despite cues  Stairs            Wheelchair Mobility    Modified Rankin (Stroke Patients Only)       Balance Overall balance assessment: Needs assistance;History of Falls   Sitting balance-Leahy Scale: Good     Standing balance support: Single extremity supported Standing balance-Leahy Scale: Poor                               Pertinent Vitals/Pain Pain Assessment: 0-10 Pain Score: 4  Pain Location: chest pain the last 20' of gait, HR 123, diminished with sitting Pain Descriptors / Indicators: Stabbing;Sharp Pain Intervention(s): Limited activity within patient's tolerance;Monitored during session;Repositioned    Home Living Family/patient expects to be discharged to:: Private residence Living Arrangements: Spouse/significant other Available Help at Discharge: Family;Available 24 hours/day Type of Home: Mobile home Home Access: Stairs to enter   Entrance Stairs-Number of Steps: 6 Home Layout: One level Home Equipment: Cane - single point      Prior Function Level of Independence: Independent with assistive device(s)         Comments: pt self-reports he holds onto furniture at home, has had more than 5 falls in the last year with assist of neighbor to get up. Reports he was bathing and dressing on his own     Hand Dominance        Extremity/Trunk Assessment   Upper Extremity Assessment Upper Extremity Assessment: Generalized weakness    Lower  Extremity Assessment Lower Extremity Assessment: Generalized weakness    Cervical / Trunk Assessment Cervical / Trunk Assessment: Kyphotic  Communication   Communication: HOH  Cognition Arousal/Alertness: Awake/alert Behavior During Therapy: Flat affect Overall Cognitive Status: Impaired/Different from baseline Area of Impairment:  Orientation;Attention;Memory;Problem solving;Safety/judgement                 Orientation Level: Disoriented to;Time Current Attention Level: Sustained Memory: Decreased short-term memory   Safety/Judgement: Decreased awareness of safety;Decreased awareness of deficits   Problem Solving: Slow processing;Decreased initiation;Requires verbal cues General Comments: difficult to discern if cognitive issues are all related to processing or partly due to hearing impairments      General Comments      Exercises     Assessment/Plan    PT Assessment Patient needs continued PT services  PT Problem List Decreased strength;Decreased balance;Decreased cognition;Decreased mobility;Decreased knowledge of use of DME;Decreased activity tolerance;Decreased safety awareness;Decreased coordination       PT Treatment Interventions DME instruction;Functional mobility training;Balance training;Patient/family education;Gait training;Therapeutic activities;Stair training;Therapeutic exercise    PT Goals (Current goals can be found in the Care Plan section)  Acute Rehab PT Goals Patient Stated Goal: return home PT Goal Formulation: With patient Time For Goal Achievement: 10/06/18 Potential to Achieve Goals: Fair    Frequency Min 3X/week   Barriers to discharge Decreased caregiver support pt reports wife moves ok but has required hernia surgery every year    Co-evaluation               AM-PAC PT "6 Clicks" Mobility  Outcome Measure Help needed turning from your back to your side while in a flat bed without using bedrails?: A Little Help needed moving from lying on your back to sitting on the side of a flat bed without using bedrails?: A Little Help needed moving to and from a bed to a chair (including a wheelchair)?: A Little Help needed standing up from a chair using your arms (e.g., wheelchair or bedside chair)?: A Little Help needed to walk in hospital room?: A Little Help  needed climbing 3-5 steps with a railing? : A Little 6 Click Score: 18    End of Session Equipment Utilized During Treatment: Gait belt Activity Tolerance: Patient tolerated treatment well Patient left: in chair;with call bell/phone within reach;with chair alarm set Nurse Communication: Mobility status PT Visit Diagnosis: Other abnormalities of gait and mobility (R26.89);History of falling (Z91.81);Muscle weakness (generalized) (M62.81);Difficulty in walking, not elsewhere classified (R26.2)    Time: 0677-0340 PT Time Calculation (min) (ACUTE ONLY): 24 min   Charges:   PT Evaluation $PT Eval Moderate Complexity: 1 Mod PT Treatments $Gait Training: 8-22 mins        Taggart Prasad Abner Greenspan, PT Acute Rehabilitation Services Pager: [phone] Office: [phone]   Elese Rane B Chawn Spraggins 09/22/2018, 11:50 AM

## 2018-09-22 NOTE — TOC Initial Note (Signed)
Transition of Care [facility]) - Initial/Assessment Note    Patient Details  Name: Mathew Bennett MRN: [id number] Date of Birth: 12/06/1940  [facility] of [facility]) CM/SW Contact:    Gala Lewandowsky, RN Phone Number: 09/22/2018, 4:08 PM  Clinical Narrative:  Pt presented for Chest Pain. PTA from home with the support of wife. CM did speak with patient and wife- both declining HH PT Services at this time. Wife did accept the rolling walker from [employer]. Order placed and will be delivered to the room. CM did alert MD that [facility] Services has been declined. Patient and wife aware that if they see services are needed when he gets home to call his Primary Care Provider. No further needs from CM at this time.                Expected Discharge Plan: Home/Self Care Barriers to Discharge: No Barriers Identified   Patient Goals and CMS Choice Patient states their goals for this hospitalization and ongoing recovery are:: To get back home with his wife   Choice offered to / list presented to : NA  Expected Discharge Plan and Services Expected Discharge Plan: Home/Self Care In-house Referral: NA Discharge Planning Services: CM Consult Post Acute Care Choice: Durable Medical Equipment Living arrangements for the past 2 months: Single Family Home                 DME Arranged: Walker rolling DME Agency: [employer] Date DME Agency Contacted: 09/22/18 Time DME Agency Contacted: 1423 Representative spoke with at DME Agency: Zack HH Arranged: Refused HH HH Agency: NA        Prior Living Arrangements/Services Living arrangements for the past 2 months: Single Family Home Lives with:: Spouse Patient language and need for interpreter reviewed:: Yes Do you feel safe going back to the place where you live?: Yes      Need for Family Participation in Patient Care: Yes (Comment) Care giver support system in place?: Yes (comment) Current home services: (none) Criminal Activity/Legal Involvement  Pertinent to Current Situation/Hospitalization: No - Comment as needed  Activities of Daily Living Home Assistive Devices/Equipment: Cane (specify quad or straight) ADL Screening (condition at time of admission) Patient's cognitive ability adequate to safely complete daily activities?: Yes Is the patient deaf or have difficulty hearing?: Yes Does the patient have difficulty seeing, even when wearing glasses/contacts?: No Does the patient have difficulty concentrating, remembering, or making decisions?: No Patient able to express need for assistance with ADLs?: Yes Does the patient have difficulty dressing or bathing?: No Independently performs ADLs?: Yes (appropriate for developmental age) Does the patient have difficulty walking or climbing stairs?: Yes Weakness of Legs: Both Weakness of Arms/Hands: Both  Permission Sought/Granted Permission sought to share information with : Family Supports                Emotional Assessment Appearance:: Appears stated age Attitude/Demeanor/Rapport: Engaged Affect (typically observed): Accepting Orientation: : Oriented to Self, Oriented to Situation, Oriented to Place, Oriented to  Time Alcohol / Substance Use: Tobacco Use Psych Involvement: No (comment)  Admission diagnosis:  Chest pain, unspecified type [R07.9] Patient Active Problem List   Diagnosis Date Noted  . Ureteral calculus 09/21/2018  . Hydronephrosis 09/21/2018  . Chest pain 09/20/2018  . Renal insufficiency 09/20/2018  . Atrial fibrillation (HCC) 09/20/2018  . Acute unilateral obstructive uropathy 09/20/2018   PCP:  Larene Pickett, FNP Pharmacy:   [facility] [phone] - [address] [place], [place] - [phone] [address] [facility] [address] [place] [id number] Phone: [phone] Fax: [phone]     Social Determinants of Health (SDOH) Interventions    Readmission Risk Interventions No flowsheet data found.

## 2018-09-23 ENCOUNTER — Other Ambulatory Visit: Payer: Self-pay | Admitting: Urology

## 2018-09-23 ENCOUNTER — Encounter (HOSPITAL_COMMUNITY): Payer: Self-pay | Admitting: *Deleted

## 2018-09-23 ENCOUNTER — Other Ambulatory Visit: Payer: Self-pay | Admitting: Cardiology

## 2018-09-23 ENCOUNTER — Encounter (HOSPITAL_COMMUNITY): Payer: Self-pay | Admitting: Internal Medicine

## 2018-09-23 DIAGNOSIS — I208 Other forms of angina pectoris: Secondary | ICD-10-CM

## 2018-09-23 DIAGNOSIS — N132 Hydronephrosis with renal and ureteral calculous obstruction: Secondary | ICD-10-CM

## 2018-09-23 DIAGNOSIS — I2510 Atherosclerotic heart disease of native coronary artery without angina pectoris: Secondary | ICD-10-CM | POA: Diagnosis present

## 2018-09-23 LAB — BASIC METABOLIC PANEL
Anion gap: 12 (ref 5–15)
BUN: 21 mg/dL (ref 8–23)
CO2: 20 mmol/L — ABNORMAL LOW (ref 22–32)
Calcium: 9 mg/dL (ref 8.9–10.3)
Chloride: 104 mmol/L (ref 98–111)
Creatinine, Ser: 1.31 mg/dL — ABNORMAL HIGH (ref 0.61–1.24)
GFR calc Af Amer: >60 mL/min (ref >60)
GFR calc non Af Amer: 52 mL/min — ABNORMAL LOW (ref >60)
Glucose, Bld: 80 mg/dL (ref 70–99)
Potassium: 3.6 mmol/L (ref 3.5–5.1)
Sodium: 136 mmol/L (ref 135–145)

## 2018-09-23 LAB — LIPID PANEL
Cholesterol: 125 mg/dL (ref 0–200)
HDL: 34 mg/dL — ABNORMAL LOW (ref >40)
LDL Cholesterol: 73 mg/dL (ref 0–99)
Total CHOL/HDL Ratio: 3.7 RATIO
Triglycerides: 91 mg/dL (ref <150)
VLDL: 18 mg/dL (ref 0–40)

## 2018-09-23 MED ORDER — OXYCODONE-ACETAMINOPHEN 5-325 MG PO TABS: 1.0000 | ORAL_TABLET | Freq: Four times a day (QID) | ORAL | 0 refills | Status: DC | PRN

## 2018-09-23 MED ORDER — ISOSORBIDE MONONITRATE ER 30 MG PO TB24
30.0000 mg | ORAL_TABLET | Freq: Every day | ORAL | Status: DC
  Administered 2018-09-23: 30 mg via ORAL
  Filled 2018-09-23: qty 1

## 2018-09-23 MED ORDER — ISOSORBIDE MONONITRATE ER 30 MG PO TB24: 30.0000 mg | ORAL_TABLET | Freq: Every day | ORAL | 1 refills | Status: AC

## 2018-09-23 MED ORDER — OXYCODONE-ACETAMINOPHEN 5-325 MG PO TABS: 1.0000 | ORAL_TABLET | Freq: Four times a day (QID) | ORAL | Status: DC | PRN

## 2018-09-23 MED ORDER — ATORVASTATIN CALCIUM 40 MG PO TABS: 40.0000 mg | ORAL_TABLET | Freq: Every day | ORAL | Status: DC

## 2018-09-23 MED FILL — ISOSORBIDE MN ER 30 MG TAB: 30 | 30 days supply | Qty: 30 | Fill #0 | Status: TO

## 2018-09-23 NOTE — Progress Notes (Signed)
Spoke to patient's wife via phone,history obtained,updated.  Bring blue folder,insurance cards,picture ID,designated driver and living will,POA, if desires (to be placed on chart). Reinforced no aspirin(instructions to hold aspirin per your doctor), ibuprofen products 72 hours prior to procedure. No vitamins or herbal medicines 7 days prior to procedure.   Follow laxative instructions provided by urologist (office) and in blue folder. Wear easy on/off clothing and no jewelry except wedding rings and ear rings. Leave all other valuables at home. Verbalizes understanding of instructions  No alcohol 24 hours prior to procedure.call md office if you have a cold,sorethroat or fever. Shower or bathe before your treatment. NPO after midnight. To come for Covid 19 testing on 09/27/2018. Patient just discharged from hospital today and wife is taking down all information for lithotripsy.

## 2018-09-23 NOTE — Addendum Note (Signed)
Addended by: Bjorn Pippin on: 09/23/2018 01:31 PM   Modules accepted: Orders

## 2018-09-23 NOTE — Discharge Summary (Addendum)
Physician Discharge Summary  Mathew ChessBilly J Ebersole ZOX:[id number] DOB: March 05, 1941 DOA: 09/20/2018  PCP: Larene Pickettondit, Bridget F, FNP  Admit date: 09/20/2018 Discharge date: 09/23/2018  Time spent: 45 minutes  Recommendations for Outpatient Follow-up:  1. Urology office will call regarding lithotripsy 5/28 2. Cardiology will call to schedule lexiscan myoview after 5/28    Discharge Diagnoses:  Active Problems:   Chest pain   Renal insufficiency   Atrial fibrillation (HCC)   Acute unilateral obstructive uropathy   Ureteral calculus   Hydronephrosis   CAD (coronary artery disease)   Stable angina [facility](HCC)   Discharge Condition: stable  Diet recommendation: heart healthy  Filed Weights   09/21/18 0007 09/22/18 0443 09/23/18 0517  Weight: 97.8 kg 99.4 kg 99.2 kg    History of present illness:  Mathew Bennett is a 78 y.o. male here with flank pain from a right ureteral obstructing stone admitted 5/19. Hx cad s/p tents on asa and plavix. Complained of intermittent CP with exertion.   Unable to contact PCP or retired cardiologist for records of when stent was placed.  Per wife, cardiology retired > 4 years ago so suspect at least 4 years prior. Evaluated by cards and urology   Hospital Course:  1.Chest pain; CAD. Experienced chest pain with ambulation. Reported intermittent chest pain with stairs and chores over last several months. Hepresentedwith 3 days of right flank pain and then acute-onset of chest pain with nausea and diaphoresis just prior to arrivalTroponin negative x3,CXR without acute findings, and EKG with a fib, PVC, and RBBB no acute changes.Patient reports hx of CAD with stents. Of note efforts to obtain medical records have been fruitless. Wife does not [facility]. Patient not seen in over 4 years. Home meds include plavix and asa.Echo with EF 60%. Evaluated by cardiology who opine likely CAD with stable angina. Cleared for low risk lithotripsy and  recommended lexiscan myoview after lithotripsy. Stopping plavix for now in anticipation of lithotripsy.   2.Right ureteral obstructing stone/hydronephrosis Pain managed with oral meds. Patient with obstructing right proximal ureteral stone measuring 8 x 9 mm with possible forniceal rupture.. No fever, nausea. Continues with decreased oral intake. Urology recommending  lithotripsy next week likley 5/28. Discharge with pain meds  3.Renal insufficiencySCr isstable trending down to 1.5.Noprior labs for comparisonRight-sided ureteral obstruction noted on CT as aboveUnclear chronicity, possibly some acute prerenal injury component from N/V. Urine sodium and urine creatinine within limits of normal.    4.Atrial fibrillationHR high end of normal. CHADS-VASc is at least 3 (age x2, CAD)Home meds include Plavix, atenolol. Stopping plavix at discharge in anticipation of lithotripsy. Unclear why remaining on asa and plavix  (? S/p stents). Suspect patient lost to follow up and as a result, never discontinued. Continue BB  Procedures:    Consultations:  Dr borden urology  Dr Royann Shiversroitoru  Discharge Exam: Vitals:   09/23/18 0517 09/23/18 0836  BP: (!) 145/81 114/82  Pulse: 96 100  Resp: 20   Temp: 98 F (36.7 C)   SpO2: 100%     General: awake alert smiling in no acute distress Cardiovascular: rrr no mgr no LE edema Respiratory: normal effort BS clear bilaterally no wheeze  Discharge Instructions   Discharge Instructions    Ambulatory referral to Cardiology   Complete by:  As directed    Was seen as inpatient. Will need myoview done after lithotripsy scheduled for 5/28.   Call MD for:  severe uncontrolled pain   Complete by:  As directed    Call MD for:  temperature >100.4   Complete by:  As directed    Diet - low sodium heart healthy   Complete by:  As directed    Discharge instructions   Complete by:  As directed    Take medications as prescribed.  Urology office  will contact you regarding lithotripsy to be done 5/28.   Increase activity slowly   Complete by:  As directed      Allergies as of 09/23/2018   No Known Allergies     Medication List    STOP taking these medications   clopidogrel 75 MG tablet Commonly known as:  PLAVIX     TAKE these medications   aspirin 81 MG chewable tablet Chew 81 mg by mouth daily.   atenolol 50 MG tablet Commonly known as:  TENORMIN Take 25 mg by mouth daily.   atorvastatin 40 MG tablet Commonly known as:  LIPITOR Take 40 mg by mouth daily at 6 PM.   fenofibrate 160 MG tablet Take 160 mg by mouth every evening.   isosorbide mononitrate 30 MG 24 hr tablet Commonly known as:  IMDUR Take 1 tablet (30 mg total) by mouth daily.   NEXIUM PO Take 1 capsule by mouth daily.   oxyCODONE-acetaminophen 5-325 MG tablet Commonly known as:  PERCOCET/ROXICET Take 1 tablet by mouth every 6 (six) hours as needed for severe pain.   sertraline 100 MG tablet Commonly known as:  ZOLOFT Take 100 mg by mouth daily.   tamsulosin 0.4 MG Caps capsule Commonly known as:  FLOMAX Take 0.4 mg by mouth daily.            Durable Medical Equipment  (From admission, onward)         Start     Ordered   09/22/18 1607  For home use only DME Walker rolling  Once    Question:  Patient needs a walker to treat with the following condition  Answer:  Weakness   09/22/18 1607         No Known Allergies Follow-up Information    Larene Pickett, FNP. Call on 09/27/2018.   Specialty:  Family Medicine Why:  :38 with Tele Visit and office will call Contact information: [address] [place]  [place] [address] [phone]        [facility] Office Follow up.   Specialty:  Cardiology Why:  You have a cardiac stress test scheduled for 10/11/18 at 8:30 am. Please see attached instructions for test.  Contact information: [address], [address] [place] [address] [phone]        Croitoru, Rachelle Hora, MD Follow up.   Specialty:  Cardiology Why:  Cardiology hospital follow up after stress test, 10/17/18 at 9:00 with Corine Shelter, Dr. Erin Hearing PA. This will be a virtual visit from home. Please see attached instructions for the visit.  Contact information: [address] [place] [address] [phone]            The results of significant diagnostics from this hospitalization (including imaging, microbiology, ancillary and laboratory) are listed below for reference.    Significant Diagnostic Studies: Dg Abd 1 View  Result Date: 09/21/2018 CLINICAL DATA:  Abdominal pain. EXAM: ABDOMEN - 1 VIEW COMPARISON:  CT scan of Sep 20, 2018. FINDINGS: The bowel gas pattern is normal. Right renal calculus is noted. Right ureteral calculus is noted to the right of L3-4 disc space. IMPRESSION: Right  renal and ureteral calculi are noted as described above. No evidence of bowel obstruction or ileus. Electronically Signed   By: Lupita Raider M.D.   On: 09/21/2018 14:23   Ct Head Wo Contrast  Result Date: 09/20/2018 CLINICAL DATA:  Acute pain due to trauma EXAM: CT HEAD WITHOUT CONTRAST CT CERVICAL SPINE WITHOUT CONTRAST TECHNIQUE: Multidetector CT imaging of the head and cervical spine was performed following the standard protocol without intravenous contrast. Multiplanar CT image reconstructions of the cervical spine were also generated. COMPARISON:  None. FINDINGS: CT HEAD FINDINGS Brain: No evidence of acute infarction, hemorrhage, hydrocephalus, extra-axial collection or mass lesion/mass effect. 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 There is significant volume loss, somewhat greater than expected for the patient's age. Vascular: No hyperdense vessel or unexpected calcification. Skull: Normal. Negative for fracture or focal lesion. Sinuses/Orbits: No acute finding. Other: None. CT CERVICAL SPINE FINDINGS Alignment: Normal. Skull base and vertebrae: No  acute fracture. No primary bone lesion or focal pathologic process. Soft tissues and spinal canal: No prevertebral fluid or swelling. No visible canal hematoma. Disc levels: There is mild to moderate multilevel disc height loss throughout the cervical spine. Upper chest: Negative. Other: None IMPRESSION: 1. No acute intracranial abnormality detected. 2. No acute cervical spine fracture. 3. 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 4. Mild multilevel degenerative changes are seen involving the cervical spine. Electronically Signed   By: Katherine Mantle M.D.   On: 09/20/2018 22:06   Ct Cervical Spine Wo Contrast  Result Date: 09/20/2018 CLINICAL DATA:  Acute pain due to trauma EXAM: CT HEAD WITHOUT CONTRAST CT CERVICAL SPINE WITHOUT CONTRAST TECHNIQUE: Multidetector CT imaging of the head and cervical spine was performed following the standard protocol without intravenous contrast. Multiplanar CT image reconstructions of the cervical spine were also generated. COMPARISON:  None. FINDINGS: CT HEAD FINDINGS Brain: No evidence of acute infarction, hemorrhage, hydrocephalus, extra-axial collection or mass lesion/mass effect. 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 There is significant volume loss, somewhat greater than expected for the patient's age. Vascular: No hyperdense vessel or unexpected calcification. Skull: Normal. Negative for fracture or focal lesion. Sinuses/Orbits: No acute finding. Other: None. CT CERVICAL SPINE FINDINGS Alignment: Normal. Skull base and vertebrae: No acute fracture. No primary bone lesion or focal pathologic process. Soft tissues and spinal canal: No prevertebral fluid or swelling. No visible canal hematoma. Disc levels: There is mild to moderate multilevel disc height loss throughout the cervical spine. Upper chest: Negative. Other: None IMPRESSION: 1. No acute intracranial abnormality detected. 2. No acute cervical spine fracture. 3. Chronic microvascular ischemic  changes are noted. 4. Mild multilevel degenerative changes are seen involving the cervical spine. Electronically Signed   By: Katherine Mantle M.D.   On: 09/20/2018 22:06   Dg Chest Portable 1 View  Result Date: 09/20/2018 CLINICAL DATA:  Chest pain EXAM: PORTABLE CHEST 1 VIEW COMPARISON:  CT 09/20/2018 FINDINGS: Mild cardiomegaly. No focal consolidation or effusion. Aortic atherosclerosis. No pneumothorax. IMPRESSION: No active disease.  Mild cardiomegaly. Electronically Signed   By: Jasmine Pang M.D.   On: 09/20/2018 22:15   Ct Renal Stone Study  Result Date: 09/20/2018 CLINICAL DATA:  78 year old male with right flank pain since yesterday with vomiting. EXAM: CT ABDOMEN AND PELVIS WITHOUT CONTRAST TECHNIQUE: Multidetector CT imaging of the abdomen and pelvis was performed following the standard protocol without IV contrast. COMPARISON:  None available. FINDINGS: Lower chest: Noncalcified posterior pleural plaque in the right lung, posterolateral pleural plaque in the left lower  lobe (series 7, image 4). Associated bilateral lung base increased reticular opacity, possibly developing fibrosis. No definite pleural fluid. No pericardial effusion. Calcified coronary artery atherosclerosis and/or stents. Hepatobiliary: Negative noncontrast liver and gallbladder. Pancreas: Negative aside from some atrophy. Spleen: Negative. Adrenals/Urinary Tract: Normal adrenal glands. Negative noncontrast left kidney and left ureter. Unremarkable urinary bladder. The distal right ureter is normal. There is mild-to-moderate right pararenal stranding and moderate right periureteral stranding which tracks caudally in the right retroperitoneal space (series 3, image 56). There is a large oval 8 x 9 millimeter calculus in the proximal right ureter about 15 millimeters distal to the right ureteropelvic junction with hydronephrosis. There is an additional 6 millimeter right intrarenal calculus near the midpole. Stomach/Bowel:  Decompressed and negative descending, rectosigmoid colon. Negative transverse colon. There is mild stranding in the right gutter but this appears related to the right pararenal space. No convincing large bowel inflammation. Negative terminal ileum. Diminutive or absent appendix. No dilated small bowel. Small fat containing hiatal hernia. Negative stomach. No free air. Vascular/Lymphatic: Vascular patency is not evaluated in the absence of IV contrast. Extensive Aortoiliac calcified atherosclerosis. 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 No lymphadenopathy. Reproductive: Small fat containing right inguinal hernia. Other: Trace pelvic free fluid ventral to the rectum on series 3, image 79. Simple fluid density. Musculoskeletal: Osteopenia. No acute osseous abnormality identified. IMPRESSION: 1. Acute obstructive uropathy on the right with a large 8 x 9 mm proximal right ureteral calculus about 15 mm distal to the right UPJ. Possible forniceal rupture. 2. 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 Aortic Atherosclerosis (ICD10-I70.0). 3. 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 4. Small fat containing hiatal and right inguinal hernias. Electronically Signed   By: Odessa Fleming M.D.   On: 09/20/2018 20:27    Microbiology: Recent Results (from the past 240 hour(s))  Urine culture     Status: Abnormal   Collection Time: 09/20/18  7:42 PM  Result Value Ref Range Status   Specimen Description URINE, CLEAN CATCH  Final   Special Requests Normal  Final   Culture (A)  Final    <10,000 COLONIES/mL INSIGNIFICANT GROWTH Performed at [facility], [address], [place], [place] [address]    Report Status 09/22/2018 FINAL  Final  SARS Coronavirus 2 (CEPHEID - Performed in [facility] hospital lab), Hosp Order     Status: None   Collection Time: 09/20/18  10:16 PM  Result Value Ref Range Status   SARS Coronavirus 2 NEGATIVE NEGATIVE Final    Comment: (NOTE) If result is NEGATIVE SARS-CoV-2 target nucleic acids are NOT DETECTED. The SARS-CoV-2 RNA is generally detectable in upper and lower  respiratory specimens during the acute phase of infection. The lowest  concentration of SARS-CoV-2 viral copies this assay can detect is 250  copies / mL. A negative result does not preclude SARS-CoV-2 infection  and should not be used as the sole basis for treatment or other  patient management decisions.  A negative result may occur with  improper specimen collection / handling, submission of specimen other  than nasopharyngeal swab, presence of viral mutation(s) within the  areas targeted by this assay, and inadequate number of viral copies  (<250 copies / mL). A negative result must be combined with clinical  observations, patient history, and epidemiological information. If result is POSITIVE SARS-CoV-2 target nucleic acids  are DETECTED. The SARS-CoV-2 RNA is generally detectable in upper and lower  respiratory specimens dur ing the acute phase of infection.  Positive  results are indicative of active infection with SARS-CoV-2.  Clinical  correlation with patient history and other diagnostic information is  necessary to determine patient infection status.  Positive results do  not rule out bacterial infection or co-infection with other viruses. If result is PRESUMPTIVE POSTIVE SARS-CoV-2 nucleic acids MAY BE PRESENT.   A presumptive positive result was obtained on the submitted specimen  and confirmed on repeat testing.  While 2019 novel coronavirus  (SARS-CoV-2) nucleic acids may be present in the submitted sample  additional confirmatory testing may be necessary for epidemiological  and / or clinical management purposes  to differentiate between  SARS-CoV-2 and other Sarbecovirus currently known to infect humans.  If clinically indicated  additional testing with an alternate test  methodology [id number]) is advised. The SARS-CoV-2 RNA is generally  detectable in upper and lower respiratory sp ecimens during the acute  phase of infection. The expected result is Negative. Fact Sheet for Patients:  BoilerBrush.com.cy Fact Sheet for Healthcare Providers: https://pope.com/ This test is not yet approved or cleared by the [place] FDA and has been authorized for detection and/or diagnosis of SARS-CoV-2 by FDA under an Emergency Use Authorization (EUA).  This EUA will remain in effect (meaning this test can be used) for the duration of the COVID-19 declaration under Section 564(b)(1) of the Act, 21 U.S.C. section 360bbb-3(b)(1), unless the authorization is terminated or revoked sooner. Performed at [facility], [address], [place], [place] [address]      Labs: Basic Metabolic Panel: Recent Labs  Lab 09/20/18 1715 09/21/18 0418 09/22/18 0411 09/23/18 0417  NA 139 140 137 136  K 3.6 3.6 3.7 3.6  CL 107 108 105 104  CO2 22 20* 21* 20*  GLUCOSE 108* 99 90 80  BUN CREATININE 1.70* 1.70* 1.57* 1.31*  CALCIUM 9.3 9.2 8.9 9.0   Liver Function Tests: Recent Labs  Lab 09/20/18 1715  AST 33  ALT 17  ALKPHOS 49  BILITOT 1.3*  PROT 6.4*  ALBUMIN 3.4*   No results for input(s): LIPASE, AMYLASE in the last 168 hours. No results for input(s): AMMONIA in the last 168 hours. CBC: Recent Labs  Lab 09/20/18 1715 09/21/18 0418 09/22/18 0411  WBC 7.0 7.3 6.9  NEUTROABS 5.7  --   --   HGB 13.8 13.6 13.9  HCT 43.5 42.3 43.6  MCV 84.3 83.6 84.0  PLT 155 148* 140*   Cardiac Enzymes: Recent Labs  Lab 09/21/18 0418 09/21/18 0909 09/22/18 0933 09/22/18 1510 09/22/18 2133  TROPONINI <0.03 <0.03 <0.03 <0.03 <0.03   BNP: BNP (last 3 results) No results for input(s): BNP in the last 8760 hours.  ProBNP (last 3 results) No results for  input(s): PROBNP in the last 8760 hours.  CBG: Recent Labs  Lab 09/20/18 2335  GLUCAP 95       Signed:  Joseph Art DO [facility] 09/23/2018, 11:49 AM

## 2018-09-23 NOTE — Evaluation (Signed)
Occupational Therapy Evaluation Patient Details Name: Mathew Bennett MRN: [id number] DOB: [birth year] Today's Date: 09/23/2018    History of Present Illness 78 yo with PMHx: CAD, nephrolithiasis, and atrial fibrillation, admitted with right flank pain with Rt uretral stone pt had acute CP with negative enzymes   Clinical Impression   Pt reports that at home his wife would help him "with whatever he needed" but that he does all his ADL on his own. He reports having a shower chair, "But I don't use it" he was min guard for transfers, able to perform sink level grooming. Pt needed assist for getting pants over "sticky" socks but otherwise min guard to set up for dressing tasks without physical assist from staff. [facility] but Pt is discharging and refusing HH therapies. OT to sign off at this time.     Follow Up Recommendations  Home health OT(Pt is declining HH services at this time)    Equipment Recommendations  None recommended by OT(has shower seat already)    Recommendations for Other Services       Precautions / Restrictions Precautions Precautions: Fall Restrictions Weight Bearing Restrictions: No      Mobility Bed Mobility Overal bed mobility: Needs Assistance Bed Mobility: Supine to Sit     Supine to sit: Supervision;HOB elevated     General bed mobility comments: use of bed rail to assist  Transfers Overall transfer level: Needs assistance Equipment used: Rolling walker (2 wheeled) Transfers: Sit to/from Stand Sit to Stand: Min guard         General transfer comment: guarding with cues for hand placement    Balance Overall balance assessment: Needs assistance;History of Falls   Sitting balance-Leahy Scale: Good Sitting balance - Comments: dynamic sitting balance for dressing tasks   Standing balance support: Bilateral upper extremity supported Standing balance-Leahy Scale: Poor                              ADL either performed or assessed with clinical judgement   ADL Overall ADL's : Needs assistance/impaired Eating/Feeding: Independent;Sitting   Grooming: Min guard;Standing;Wash/dry hands;Wash/dry face;Oral care Grooming Details (indicate cue type and reason): sink level, leans against surface Upper Body Bathing: Min guard;Sitting   Lower Body Bathing: Min guard;Sit to/from stand   Upper Body Dressing : Set up;Sitting Upper Body Dressing Details (indicate cue type and reason): to don shirt with buttons Lower Body Dressing: Min guard;Sit to/from stand;Minimal assistance Lower Body Dressing Details (indicate cue type and reason): min A to get pants over "grippy" socks Toilet Transfer: Min guard;Ambulation;RW Toilet Transfer Details (indicate cue type and reason): vc for safety with RW, cues to use it in the first place Toileting- Clothing Manipulation and Hygiene: Min Aeronautical engineerguard;Sitting/lateral lean     Tub/Shower Transfer Details (indicate cue type and reason): educated on importance of using shower seat for safety during bathing Functional mobility during ADLs: Min guard;Rolling walker;Cueing for safety General ADL Comments: decreased awareness of safety and deficits     Vision         Perception     Praxis      Pertinent Vitals/Pain Pain Assessment: No/denies pain Pain Intervention(s): Monitored during session     Hand Dominance     Extremity/Trunk Assessment Upper Extremity Assessment Upper Extremity Assessment: Generalized weakness   Lower Extremity Assessment Lower Extremity Assessment: Defer to PT evaluation   Cervical /  Trunk Assessment Cervical / Trunk Assessment: Kyphotic   Communication Communication Communication: HOH   Cognition Arousal/Alertness: Awake/alert Behavior During Therapy: WFL for tasks assessed/performed Overall Cognitive Status: Impaired/Different from baseline Area of Impairment: Orientation;Attention;Memory;Problem  solving;Safety/judgement                 Orientation Level: Disoriented to;Time Current Attention Level: Sustained Memory: Decreased short-term memory   Safety/Judgement: Decreased awareness of safety;Decreased awareness of deficits   Problem Solving: Slow processing;Decreased initiation;Requires verbal cues General Comments: giving variying levels of assist needed PTA   General Comments       Exercises General Exercises - Lower Extremity Long Arc Quad: AROM;15 reps;Seated;Both Hip Flexion/Marching: AROM;15 reps;Seated;Both   Shoulder Instructions      Home Living Family/patient expects to be discharged to:: Private residence Living Arrangements: Spouse/significant other Available Help at Discharge: Family;Available 24 hours/day Type of Home: Mobile home Home Access: Stairs to enter Entrance Stairs-Number of Steps: 6   Home Layout: One level     Bathroom Shower/Tub: Chief Strategy Officer: Standard     Home Equipment: Cane - single point;Shower seat   Additional Comments: does not use shower seat "I just prefer to stand"      Prior Functioning/Environment Level of Independence: Independent with assistive device(s)        Comments: pt self-reports he holds onto furniture at home, has had more than 5 falls in the last year with assist of neighbor to get up. Reports he was bathing and dressing on his own        OT Problem List: Decreased strength;Impaired balance (sitting and/or standing);Decreased activity tolerance;Decreased cognition;Decreased safety awareness;Decreased knowledge of use of DME or AE      OT Treatment/Interventions:      OT Goals(Current goals can be found in the care plan section) Acute Rehab OT Goals Patient Stated Goal: get home to my wife OT Goal Formulation: With patient Time For Goal Achievement: 10/07/18 Potential to Achieve Goals: Good  OT Frequency:     Barriers to D/C:            Co-evaluation               AM-PAC OT "6 Clicks" Daily Activity     Outcome Measure Help from another person eating meals?: None Help from another person taking care of personal grooming?: A Little Help from another person toileting, which includes using toliet, bedpan, or urinal?: A Little Help from another person bathing (including washing, rinsing, drying)?: A Little Help from another person to put on and taking off regular upper body clothing?: None Help from another person to put on and taking off regular lower body clothing?: A Little 6 Click Score: 20   End of Session Equipment Utilized During Treatment: Gait belt;Rolling walker Nurse Communication: Mobility status  Activity Tolerance: Patient tolerated treatment well Patient left: in bed;with call bell/phone within reach;with nursing/sitter in room  OT Visit Diagnosis: Unsteadiness on feet (R26.81);Other abnormalities of gait and mobility (R26.89);Repeated falls (R29.6);History of falling (Z91.81);Muscle weakness (generalized) (M62.81);Other symptoms and signs involving cognitive function                Time: 1210-1228 OT Time Calculation (min): 18 min Charges:  OT General Charges $OT Visit: 1 Visit OT Evaluation $OT Eval Moderate Complexity: 1 Mod  Sherryl Manges OTR/L Acute Rehabilitation Services Pager: [phone] Office: [phone]  Evern Bio Ayo Guarino 09/23/2018, 1:17 PM

## 2018-09-23 NOTE — Progress Notes (Signed)
Physical Therapy Treatment Patient Details Name: Mathew ChessBilly J Bennett MRN: [id number] DOB: 1940-11-10 Today's Date: 09/23/2018    History of Present Illness 78 yo with PMHx: CAD, nephrolithiasis, and atrial fibrillation, admitted with right flank pain with Rt uretral stone pt had acute CP with negative enzymes    PT Comments    Pt remains HOH with decreased safety awareness but improved ability with gait with use of RW today, able to complete stairs and HEP. D/C plan remains appropriate. No chest pain this session.    Follow Up Recommendations  Home health PT;Supervision/Assistance - 24 hour     Equipment Recommendations  Rolling walker with 5" wheels    Recommendations for Other Services       Precautions / Restrictions Precautions Precautions: Fall Restrictions Weight Bearing Restrictions: No    Mobility  Bed Mobility Overal bed mobility: Needs Assistance Bed Mobility: Supine to Sit     Supine to sit: Min assist     General bed mobility comments: HOb 15 degrees with pt reaching out for HHA to ease ascent to sitting  Transfers Overall transfer level: Needs assistance   Transfers: Sit to/from Stand Sit to Stand: Min guard         General transfer comment: guarding with cues for hand placement  Ambulation/Gait Ambulation/Gait assistance: Min guard Gait Distance (Feet): 300 Feet Assistive device: Rolling walker (2 wheeled) Gait Pattern/deviations: Step-through pattern;Decreased stride length   Gait velocity interpretation: >2.62 ft/sec, indicative of community ambulatory General Gait Details: cues for position in Rw, safety and cues to maintain RW on ground and not pick it up. Pt with improved stability with Rw use this session   Stairs Stairs: Yes Stairs assistance: Min guard Stair Management: Alternating pattern;Forwards;Two rails Number of Stairs: 11 General stair comments: pt able to safely ascend/descend stairs with reliance on bil rails   Wheelchair  Mobility    Modified Rankin (Stroke Patients Only)       Balance Overall balance assessment: Needs assistance;History of Falls   Sitting balance-Leahy Scale: Good     Standing balance support: Single extremity supported Standing balance-Leahy Scale: Poor                              Cognition Arousal/Alertness: Awake/alert Behavior During Therapy: Flat affect Overall Cognitive Status: Impaired/Different from baseline Area of Impairment: Orientation;Attention;Memory;Problem solving;Safety/judgement                 Orientation Level: Disoriented to;Time Current Attention Level: Sustained Memory: Decreased short-term memory   Safety/Judgement: Decreased awareness of safety;Decreased awareness of deficits   Problem Solving: Slow processing;Decreased initiation;Requires verbal cues        Exercises General Exercises - Lower Extremity Long Arc Quad: AROM;15 reps;Seated;Both Hip Flexion/Marching: AROM;15 reps;Seated;Both    General Comments        Pertinent Vitals/Pain Pain Assessment: No/denies pain    Home Living                      Prior Function            PT Goals (current goals can now be found in the care plan section) Progress towards PT goals: Progressing toward goals    Frequency           PT Plan Current plan remains appropriate    Co-evaluation              AM-PAC PT "6 Clicks" Mobility  Outcome Measure  Help needed turning from your back to your side while in a flat bed without using bedrails?: A Little Help needed moving from lying on your back to sitting on the side of a flat bed without using bedrails?: A Little Help needed moving to and from a bed to a chair (including a wheelchair)?: A Little Help needed standing up from a chair using your arms (e.g., wheelchair or bedside chair)?: A Little Help needed to walk in hospital room?: A Little Help needed climbing 3-5 steps with a railing? : A Little 6  Click Score: 18    End of Session Equipment Utilized During Treatment: Gait belt Activity Tolerance: Patient tolerated treatment well Patient left: in chair;with call bell/phone within reach;with chair alarm set Nurse Communication: Mobility status PT Visit Diagnosis: Other abnormalities of gait and mobility (R26.89);History of falling (Z91.81);Muscle weakness (generalized) (M62.81);Difficulty in walking, not elsewhere classified (R26.2)     Time: 0810-0826 PT Time Calculation (min) (ACUTE ONLY): 16 min  Charges:  $Gait Training: 8-22 mins                     Mathew Bennett, PT Acute Rehabilitation Services Pager: [phone] Office: [phone]    Mathew Bennett 09/23/2018, 11:34 AM

## 2018-09-23 NOTE — TOC Transition Note (Signed)
Transition of Care [facility]) - CM/SW Discharge Note Donn Pierini RN,BSN Transitions of Care Cross Coverage 3E - RN Case Manager [phone]   Patient Details  Name: MATTHAN KIVETT MRN: [id number] Date of Birth: Jan 12, 1941  [facility] Care [facility]) CM/SW Contact:  Darrold Span, RN Phone Number: 09/23/2018, 11:08 AM   Clinical Narrative:    Pt stable for transition home today, CM spoke with Zach with [employer]- RW to be delivered to room this AM for home. Pt is working on calling wife for transport- pt/wife per CM note yesterday have declined HH services.    Final next level of care: Home/Self Care Barriers to Discharge: No Barriers Identified   Patient Goals and CMS Choice Patient states their goals for this hospitalization and ongoing recovery are:: To get back home with his wife   Choice offered to / list presented to : NA  Discharge Placement  Home with wife.                      Discharge Plan and Services In-house Referral: NA Discharge Planning Services: CM Consult Post Acute Care Choice: Durable Medical Equipment          DME Arranged: Walker rolling DME Agency: [employer] Date DME Agency Contacted: 09/22/18 Time DME Agency Contacted: [id number] Representative spoke with at DME Agency: Zack HH Arranged: Refused HH HH Agency: NA        Social Determinants of Health (SDOH) Interventions     Readmission Risk Interventions Readmission Risk Prevention Plan 09/23/2018  Post Dischage Appt Complete  Medication Screening Complete  Transportation Screening Complete

## 2018-09-23 NOTE — Progress Notes (Signed)
Patient ID: Mathew Bennett, male   DOB: Oct 24, 1940, 78 y.o.   MRN: [id number]  Notes reviewed.  Appreciate input from Cardiology.  Patient is tentatively scheduled for ESWL next Thursday (scheduling in process and patient will be contacted directly with final details).  From a urologic standpoint, it is ok to discharge him home with oral pain medication and he can remain on ASA 81 mg.  He should remain off Plavix and should not take any NSAIDs, etc. before his surgery next week.

## 2018-09-23 NOTE — Discharge Instructions (Signed)
You have a Stress Test scheduled at [facility]. Your doctor has ordered this test to check the blood flow in your heart arteries.  Please arrive 15 minutes early for paperwork. The whole test will take several hours. You may want to bring reading material to remain occupied while undergoing different parts of the test.  Instructions:  No food/drink after midnight the night before.  It is OK to take your morning meds with a sip of water EXCEPT for those types of medicines listed below or otherwise instructed.  No caffeine/decaf products 24 hours before, including medicines such as Excedrin or Goody Powders. Call if there are any questions.   Wear comfortable clothes and shoes.   Special Medication Instructions:  Beta blockers such as metoprolol (Lopressor/Toprol XL), atenolol (Tenormin), carvedilol (Coreg), nebivolol (Bystolic), bisoprolol (Zebeta), propranolol (Inderal) should not be taken for 24 hours before the test.  Calcium channel blockers such as diltiazem (Cardizem) or verapmil (Calan) should not be taken for 24 hours before the test.  Remove nitroglycerin patches and do not take nitrate preparations such as Imdur/isosorbide the day of your test.  No Persantine/Theophylline or Aggrenox medicines should be used within 24 hours of the test.   If you are diabetic, please ask which medications to hold the day of the test.  What To Expect: When you arrive in the lab, the technician will inject a small amount of radioactive tracer into your arm through an IV while you are resting quietly. This helps [place] to form pictures of your heart. You will likely only feel a sting from the IV. After a waiting period, resting pictures will be obtained under a big camera. These are the "before" pictures.  Next, you will be prepped for the stress portion of the test. This may include either walking on a treadmill or receiving a medicine that helps to dilate blood vessels in  your heart to simulate the effect of exercise on your heart. If you are walking on a treadmill, you will walk at different paces to try to get your heart rate to a goal number that is based on your age. If your doctor has chosen the pharmacologic test, then you will receive a medicine through your IV that may cause temporary nausea, flushing, shortness of breath and sometimes chest discomfort or vomiting. This is typically short-lived and usually resolves quickly. If you experience symptoms, that does not automatically mean the test is abnormal. Some patients do not experience any symptoms at all. Your blood pressure and heart rate will be monitored, and we will be watching your EKG on a computer screen for any changes. During this portion of the test, the radiologist will inject another small amount of radioactive tracer into your IV. After a waiting period, you will undergo a second set of pictures. These are the "after" pictures.  The doctor reading the test will compare the before-and-after images to look for evidence of heart blockages or heart weakness. The test usually takes 1 day to complete, but in certain instances (for example, if a patient is over a certain weight limit), the test may be done over the span of 2 days. ========================================================================================== YOUR CARDIOLOGY TEAM HAS ARRANGED FOR AN E-VISIT FOR YOUR APPOINTMENT - PLEASE REVIEW IMPORTANT INFORMATION BELOW SEVERAL DAYS PRIOR TO YOUR APPOINTMENT  Due to the recent COVID-19 pandemic, we are transitioning in-person office visits to tele-medicine visits in an effort to decrease unnecessary exposure to our patients, their families, and staff. These  visits are billed to your insurance just like a normal visit is. We also encourage you to sign up for MyChart if you have not already done so. You will need a smartphone if possible. For patients that do not have this, we can still complete the  visit using a regular telephone but do prefer a smartphone to enable video when possible. You may have a family member that lives with you that can help. If possible, we also ask that you have a blood pressure cuff and scale at home to measure your blood pressure, heart rate and weight prior to your scheduled appointment. Patients with clinical needs that need an in-person evaluation and testing will still be able to come to the office if absolutely necessary. If you have any questions, feel free to call our office.  2-3 DAYS BEFORE YOUR APPOINTMENT  You will receive a telephone call from one of our [facility] team members - your caller ID may say "Unknown caller." If this is a video visit, we will walk you through how to get the video launched on your phone. We will remind you check your blood pressure, heart rate and weight prior to your scheduled appointment. If you have an Apple Watch or Kardia, please upload any pertinent ECG strips the day before or morning of your appointment to MyChart. Our staff will also make sure you have reviewed the consent and agree to move forward with your scheduled tele-health visit.   THE DAY OF YOUR APPOINTMENT  Approximately 15 minutes prior to your scheduled appointment, you will receive a telephone call from one of Heart[facility] team - your caller ID may say "Unknown caller."  Our staff will confirm medications, vital signs for the day and any symptoms you may be experiencing. Please have this information available prior to the time of visit start. It may also be helpful for you to have a pad of paper and pen handy for any instructions given during your visit. They will also walk you through joining the smartphone meeting if this is a video visit.  CONSENT FOR TELE-HEALTH VISIT - PLEASE REVIEW  I hereby voluntarily request, consent and authorize [facility] and its employed or contracted physicians, physician assistants, nurse practitioners or other licensed health  care professionals (the Practitioner), to provide me with telemedicine health care services (the Services") as deemed necessary by the treating Practitioner. I acknowledge and consent to receive the Services by the Practitioner via telemedicine. I understand that the telemedicine visit will involve communicating with the Practitioner through live audiovisual communication technology and the disclosure of certain medical information by electronic transmission. I acknowledge that I have been given the opportunity to request an in-person assessment or other available alternative prior to the telemedicine visit and am voluntarily participating in the telemedicine visit.  I understand that I have the right to withhold or withdraw my consent to the use of telemedicine in the course of my care at any time, without affecting my right to future care or treatment, and that the Practitioner or I may terminate the telemedicine visit at any time. I understand that I have the right to inspect all information obtained and/or recorded in the course of the telemedicine visit and may receive copies of available information for a reasonable fee.  I understand that some of the potential risks of receiving the Services via telemedicine include:   Delay or interruption in medical evaluation due to technological equipment failure or disruption;  Information transmitted may not be sufficient (  e.g. poor resolution of images) to allow for appropriate medical decision making by the Practitioner; and/or   In rare instances, security protocols could fail, causing a breach of personal health information.  Furthermore, I acknowledge that it is my responsibility to provide information about my medical history, conditions and care that is complete and accurate to the best of my ability. I acknowledge that Practitioner's advice, recommendations, and/or decision may be based on factors not within their control, such as incomplete or  inaccurate data provided by me or distortions of diagnostic images or specimens that may result from electronic transmissions. I understand that the practice of medicine is not an exact science and that Practitioner makes no warranties or guarantees regarding treatment outcomes. I acknowledge that I will receive a copy of this consent concurrently upon execution via email to the email address I last provided but may also request a printed copy by calling the office of [facility].    I understand that my insurance will be billed for this visit.   I have read or had this consent read to me.  I understand the contents of this consent, which adequately explains the benefits and risks of the Services being provided via telemedicine.   I have been provided ample opportunity to ask questions regarding this consent and the Services and have had my questions answered to my satisfaction.  I give my informed consent for the services to be provided through the use of telemedicine in my medical care  By participating in this telemedicine visit I agree to the above.

## 2018-09-23 NOTE — Progress Notes (Signed)
Progress Note  Patient Name: Mathew Bennett Date of Encounter: 09/23/2018  Primary Cardiologist: No primary care provider on file.   Subjective   No CV complaints. Some flank pain.  Inpatient Medications    Scheduled Meds: . aspirin EC  81 mg Oral Daily  . atenolol  25 mg Oral Daily  . tamsulosin  0.4 mg Oral QPC supper   Continuous Infusions:  PRN Meds: acetaminophen, nitroGLYCERIN, ondansetron (ZOFRAN) IV, oxyCODONE-acetaminophen   Vital Signs    Vitals:   09/22/18 1936 09/23/18 0517 09/23/18 0517 09/23/18 0836  BP: 132/86  (!) 145/81 114/82  Pulse: 91  96 100  Resp:   20   Temp: 98 F (36.7 C)  98 F (36.7 C)   TempSrc: Oral     SpO2: 100%  100%   Weight:  99.2 kg    Height:        Intake/Output Summary (Last 24 hours) at 09/23/2018 1055 Last data filed at 09/23/2018 0919 Gross per 24 hour  Intake 2069.3 ml  Output 950 ml  Net 1119.3 ml   Last 3 Weights 09/23/2018 09/22/2018 09/21/2018  Weight (lbs) 218 lb 12.8 oz 219 lb 3.2 oz 215 lb 11.2 oz  Weight (kg) 99.247 kg 99.428 kg 97.841 kg      Telemetry    AF rate controlled - Personally Reviewed  ECG    No new tracing - Personally Reviewed  Physical Exam  Obese. Comfortable lying flat GEN: No acute distress.   Neck: No JVD Cardiac: irregular, no murmurs, rubs, or gallops.  Respiratory: Clear to auscultation bilaterally. GI: Soft, nontender, non-distended  MS: No edema; No deformity. Neuro:  Nonfocal  Psych: Normal affect   Labs    Chemistry Recent Labs  Lab 09/20/18 1715 09/21/18 0418 09/22/18 0411 09/23/18 0417  NA 139 140 137 136  K 3.6 3.6 3.7 3.6  CL 107 108 105 104  CO2 22 20* 21* 20*  GLUCOSE 108* 99 90 80  BUN 20 22 19 21   CREATININE 1.70* 1.70* 1.57* 1.31*  CALCIUM 9.3 9.2 8.9 9.0  PROT 6.4*  --   --   --   ALBUMIN 3.4*  --   --   --   AST 33  --   --   --   ALT 17  --   --   --   ALKPHOS 49  --   --   --   BILITOT 1.3*  --   --   --   GFRNONAA 38* 38* 42* 52*  GFRAA  44* 44* 48* >60  ANIONGAP 10 12 11 12      Hematology Recent Labs  Lab 09/20/18 1715 09/21/18 0418 09/22/18 0411  WBC 7.0 7.3 6.9  RBC 5.16 5.06 5.19  HGB 13.8 13.6 13.9  HCT 43.5 42.3 43.6  MCV 84.3 83.6 84.0  MCH 26.7 26.9 26.8  MCHC 31.7 32.2 31.9  RDW 15.2 15.3 15.1  PLT 155 148* 140*    Cardiac Enzymes Recent Labs  Lab 09/21/18 0909 09/22/18 0933 09/22/18 1510 09/22/18 2133  TROPONINI <0.03 <0.03 <0.03 <0.03   No results for input(s): TROPIPOC in the last 168 hours.   BNPNo results for input(s): BNP, PROBNP in the last 168 hours.   DDimer No results for input(s): DDIMER in the last 168 hours.   Radiology    Dg Abd 1 View  Result Date: 09/21/2018 CLINICAL DATA:  Abdominal pain. EXAM: ABDOMEN - 1 VIEW COMPARISON:  CT scan of Sep 20, 2018. FINDINGS: The bowel gas pattern is normal. Right renal calculus is noted. Right ureteral calculus is noted to the right of L3-4 disc space. IMPRESSION: Right renal and ureteral calculi are noted as described above. No evidence of bowel obstruction or ileus. Electronically Signed   By: Lupita RaiderJames  Green Jr M.D.   On: 09/21/2018 14:23    Cardiac Studies   Echocardiogram 09/21/2018: 1. The left ventricle has normal systolic function, with an ejection fraction of 55-60%. The cavity size was normal. There is mild concentric left ventricular hypertrophy. Left ventricular diastolic Doppler parameters are indeterminate secondary to  atrial fibrillation. Elevated mean left atrial pressure. 2. The right ventricle has normal systolic function. The cavity was normal. There is no increase in right ventricular wall thickness. 3. Left atrial size was mild-moderately dilated. 4. Right atrial size was mildly dilated. 5. No evidence of mitral valve stenosis. 6. The aortic valve is tricuspid. Aortic valve regurgitation is mild by color flow Doppler. 7. The inferior vena cava was dilated in size with <50% respiratory variability. _______________   [facility] Impression: 1. Negative electrocardiographic response to stress with pharmacologic augmentation. 2. Normal myocardial perfusion scan. No evidence of ischemia. 3. Normal left ventricular systolic function wall motion.  This represents a low risk study.  Patient Profile     78 y.o. male with a history of CAD s/p stenting several years ago, atrial fibrillation not on anticoagulation, and nephrolithiasis, who presented with right flank pain x3 days and acute onset of chest pain and was found to have a right ureteral obstructing stone.  Assessment & Plan    CAD - Patient has a history of CAD s/p stenting over 6 years ago. Last Myoview in 2014 showed no ischemia. - he describes typical stable angina. - Troponin negative x4. No ischemic ECG changes - Temporarily off dual antiplatelet therapy with Aspirin and Plavix until lithotripsy - Continue home Atenolol and Lipitor. Will stop fenofibrate until we can evaluate the lipid profile. - outpatient Lexiscan Myoview after lithotripsy  Atrial Fibrillation - Wife reports patient has reportedly been told of any irregular heart rhythm before but unsure if this was specifically atrial fibrillation. - Telemetry shows atrial fibrillation, controlled rate - Continue home Atenolol 25mg  daily. - CHA2DS2-VASc = at least 3 (CAD, age x2). Patient not on any anticoagulation at home. Patient has a lot of balance issues and reportedly falls frequently. - will review risk/benefit ratio of chronic oral anticoagulants after the Myoview study.  Renal Insufficiency - Serum creatinine 1.70 on admission and 1.31 today. Probably elevated after vomiting. Baseline unknown.  - plan coronary angio only if there is a large area of ischemia, to avoid nephrotoxicity.  [facility] will sign off.   Medication Recommendations:   ASA on clopidogrel on temporary hold for lithotripsy. Start isosorbide mononitrate 30 mg  daily. Continue atenolol. Start statin. Other recommendations (labs, testing, etc):  Lipid panel ordered today, but will be nonfasting Follow up as an outpatient:  Lexiscan Myoview and follow up are being scheduled.  For questions or updates, please contact [facility] Please consult [email or web address] for contact info under        Signed, Thurmon FairMihai Keymiah Lyles, MD  09/23/2018, 10:55 AM

## 2018-09-27 ENCOUNTER — Other Ambulatory Visit (HOSPITAL_COMMUNITY)
Admission: RE | Admit: 2018-09-27 | Discharge: 2018-09-27 | Disposition: A | Discharge status: Home / Self Care | Payer: Medicare Other | Source: Ambulatory Visit | Attending: Urology | Admitting: Urology

## 2018-09-27 ENCOUNTER — Other Ambulatory Visit: Payer: Self-pay

## 2018-09-27 DIAGNOSIS — Z1159 Encounter for screening for other viral diseases: Secondary | ICD-10-CM | POA: Insufficient documentation

## 2018-09-27 LAB — SARS CORONAVIRUS 2 BY RT PCR (HOSPITAL ORDER, PERFORMED IN ~~LOC~~ HOSPITAL LAB): SARS Coronavirus 2: NEGATIVE

## 2018-09-29 ENCOUNTER — Encounter

## 2018-09-29 ENCOUNTER — Ambulatory Visit (HOSPITAL_COMMUNITY)
Admission: RE | Admit: 2018-09-29 | Discharge: 2018-09-29 | Disposition: A | Discharge status: Home / Self Care | Payer: Medicare Other | Attending: Urology | Admitting: Urology

## 2018-09-29 ENCOUNTER — Other Ambulatory Visit: Payer: Self-pay | Admitting: Urology

## 2018-09-29 ENCOUNTER — Ambulatory Visit: Payer: Self-pay | Admitting: Neurology

## 2018-09-29 ENCOUNTER — Ambulatory Visit (HOSPITAL_COMMUNITY): Payer: Medicare Other

## 2018-09-29 ENCOUNTER — Encounter (HOSPITAL_COMMUNITY)
Admission: RE | Disposition: A | Discharge status: Home / Self Care | Payer: Self-pay | Source: Home / Self Care | Attending: Urology

## 2018-09-29 ENCOUNTER — Encounter (HOSPITAL_COMMUNITY): Payer: Self-pay | Admitting: General Practice

## 2018-09-29 DIAGNOSIS — Z79899 Other long term (current) drug therapy: Secondary | ICD-10-CM | POA: Insufficient documentation

## 2018-09-29 DIAGNOSIS — N132 Hydronephrosis with renal and ureteral calculous obstruction: Secondary | ICD-10-CM | POA: Insufficient documentation

## 2018-09-29 DIAGNOSIS — N201 Calculus of ureter: Secondary | ICD-10-CM

## 2018-09-29 DIAGNOSIS — I252 Old myocardial infarction: Secondary | ICD-10-CM | POA: Diagnosis not present

## 2018-09-29 DIAGNOSIS — I10 Essential (primary) hypertension: Secondary | ICD-10-CM | POA: Diagnosis not present

## 2018-09-29 DIAGNOSIS — Z87891 Personal history of nicotine dependence: Secondary | ICD-10-CM | POA: Diagnosis not present

## 2018-09-29 DIAGNOSIS — Z955 Presence of coronary angioplasty implant and graft: Secondary | ICD-10-CM | POA: Diagnosis not present

## 2018-09-29 DIAGNOSIS — I2510 Atherosclerotic heart disease of native coronary artery without angina pectoris: Secondary | ICD-10-CM | POA: Diagnosis not present

## 2018-09-29 DIAGNOSIS — Z7982 Long term (current) use of aspirin: Secondary | ICD-10-CM | POA: Insufficient documentation

## 2018-09-29 DIAGNOSIS — Z7902 Long term (current) use of antithrombotics/antiplatelets: Secondary | ICD-10-CM | POA: Diagnosis not present

## 2018-09-29 HISTORY — PX: EXTRACORPOREAL SHOCK WAVE LITHOTRIPSY: SHX1557

## 2018-09-29 SURGERY — LITHOTRIPSY, ESWL
Anesthesia: LOCAL | Laterality: Right

## 2018-09-29 MED ORDER — SODIUM CHLORIDE 0.9 % IV SOLN: 250.0000 mL | INTRAVENOUS | Status: DC | PRN

## 2018-09-29 MED ORDER — DIAZEPAM 5 MG PO TABS: 10.0000 mg | ORAL_TABLET | ORAL | Status: DC

## 2018-09-29 MED ORDER — CIPROFLOXACIN HCL 500 MG PO TABS
500.0000 mg | ORAL_TABLET | ORAL | Status: AC
  Administered 2018-09-29: 500 mg via ORAL
  Filled 2018-09-29: qty 1

## 2018-09-29 MED ORDER — OXYCODONE HCL 5 MG PO TABS: 5.0000 mg | ORAL_TABLET | ORAL | Status: DC | PRN

## 2018-09-29 MED ORDER — SODIUM CHLORIDE 0.9% FLUSH: 3.0000 mL | INTRAVENOUS | Status: DC | PRN

## 2018-09-29 MED ORDER — DIPHENHYDRAMINE HCL 25 MG PO CAPS
25.0000 mg | ORAL_CAPSULE | ORAL | Status: AC
  Administered 2018-09-29: 25 mg via ORAL
  Filled 2018-09-29: qty 1

## 2018-09-29 MED ORDER — SODIUM CHLORIDE 0.9% FLUSH: 3.0000 mL | Freq: Two times a day (BID) | INTRAVENOUS | Status: DC

## 2018-09-29 MED ORDER — ACETAMINOPHEN 650 MG RE SUPP
650.0000 mg | RECTAL | Status: DC | PRN
  Filled 2018-09-29: qty 1

## 2018-09-29 MED ORDER — OXYCODONE-ACETAMINOPHEN 5-325 MG PO TABS: 1.0000 | ORAL_TABLET | Freq: Four times a day (QID) | ORAL | 0 refills | Status: AC | PRN

## 2018-09-29 MED ORDER — ACETAMINOPHEN 325 MG PO TABS: 650.0000 mg | ORAL_TABLET | ORAL | Status: DC | PRN

## 2018-09-29 MED ORDER — SODIUM CHLORIDE 0.9 % IV SOLN
INTRAVENOUS | Status: DC
  Administered 2018-09-29: 10:00:00 via INTRAVENOUS

## 2018-09-29 MED ORDER — MORPHINE SULFATE (PF) 4 MG/ML IV SOLN: 1.0000 mg | INTRAVENOUS | Status: DC | PRN

## 2018-09-29 NOTE — Progress Notes (Signed)
SPOKE W/  _patient     SCREENING SYMPTOMS OF COVID 19:   COUGH--no  RUNNY NOSE--- no  SORE THROAT---no  NASAL CONGESTION----no  SNEEZING----no  SHORTNESS OF BREATH---no  DIFFICULTY BREATHING---no  TEMP >100.0 -----no  UNEXPLAINED BODY ACHES------no  CHILLS --------no   HEADACHES ---------no  LOSS OF SMELL/ TASTE --------no    HAVE YOU OR ANY FAMILY MEMBER TRAVELLED PAST 14 DAYS OUT OF THE   COUNTY---no STATE----no COUNTRY----no  HAVE YOU OR ANY FAMILY MEMBER BEEN EXPOSED TO ANYONE WITH COVID 19? no    

## 2018-09-29 NOTE — Discharge Instructions (Signed)

## 2018-09-29 NOTE — Interval H&P Note (Signed)
History and Physical Interval Note:  No change in stone location.   09/29/2018 11:04 AM  Genevie Cheshire Wendall Stade  has presented today for surgery, with the diagnosis of RIGHT RENAL CALCULUS.  The various methods of treatment have been discussed with the patient and family. After consideration of risks, benefits and other options for treatment, the patient has consented to  Procedure(s): EXTRACORPOREAL SHOCK WAVE LITHOTRIPSY (ESWL) (Right) as a surgical intervention.  The patient's history has been reviewed, patient examined, no change in status, stable for surgery.  I have reviewed the patient's chart and labs.  Questions were answered to the patient's satisfaction.     Bjorn Pippin

## 2018-09-30 ENCOUNTER — Encounter (HOSPITAL_COMMUNITY): Payer: Self-pay | Admitting: Urology

## 2018-09-30 NOTE — Progress Notes (Addendum)
Spoke to patient via phone,history obtained,updated.  Bring blue folder,insurance cards,picture ID,designated driver and living will,POA, if desires (to be placed on chart). Reinforced no aspirin(instructions to hold aspirin per your doctor), ibuprofen products 72 hours prior to procedure. No vitamins or herbal medicines 7 days prior to procedure.   Follow laxative instructions provided by urologist (office) and in blue folder. Wear easy on/off clothing and no jewelry except wedding rings and ear rings. Leave all other valuables at home. Verbalizes understanding of instructions  No alcohol 24 hours prior to procedure.call md office if you have a cold,sorethroat or fever. Shower or bathe before your treatment. NPO after midnight. No plavix after tonight's dose. Covid testing scheduled for October 03, 2018 at 10:45AM.  SPOKE W/  Wife via phone     SCREENING SYMPTOMS OF COVID 19:   COUGH--no  RUNNY NOSE--- no  SORE THROAT---no  NASAL CONGESTION----no  SNEEZING----no  SHORTNESS OF BREATH---no  DIFFICULTY BREATHING---no  TEMP >100.0 -----no  UNEXPLAINED BODY ACHES------no  CHILLS -------- no  HEADACHES ---------no  LOSS OF SMELL/ TASTE --------no    HAVE YOU OR ANY FAMILY MEMBER TRAVELLED PAST 14 DAYS OUT OF THE   COUNTY---no STATE----no COUNTRY----no  HAVE YOU OR ANY FAMILY MEMBER BEEN EXPOSED TO ANYONE WITH COVID 19? no

## 2018-10-03 ENCOUNTER — Other Ambulatory Visit (HOSPITAL_COMMUNITY): Payer: Medicare Other

## 2018-10-03 NOTE — Discharge Instructions (Signed)

## 2018-10-04 ENCOUNTER — Other Ambulatory Visit: Payer: Self-pay

## 2018-10-04 ENCOUNTER — Other Ambulatory Visit (HOSPITAL_COMMUNITY)
Admission: RE | Admit: 2018-10-04 | Discharge: 2018-10-04 | Disposition: A | Discharge status: Home / Self Care | Payer: Medicare Other | Source: Ambulatory Visit | Attending: Urology | Admitting: Urology

## 2018-10-04 DIAGNOSIS — Z1159 Encounter for screening for other viral diseases: Secondary | ICD-10-CM | POA: Insufficient documentation

## 2018-10-04 LAB — SARS CORONAVIRUS 2 BY RT PCR (HOSPITAL ORDER, PERFORMED IN ~~LOC~~ HOSPITAL LAB): SARS Coronavirus 2: NEGATIVE

## 2018-10-05 NOTE — Progress Notes (Signed)
SPOKE W/  _patient's wife     SCREENING SYMPTOMS OF COVID 19:   COUGH--no  RUNNY NOSE--- no  SORE THROAT---no  NASAL CONGESTION----no  SNEEZING----no  SHORTNESS OF BREATH---no  DIFFICULTY BREATHING---no  TEMP >100.0 -----no  UNEXPLAINED BODY ACHES------no  CHILLS -------- no  HEADACHES ---------no  LOSS OF SMELL/ TASTE --------no    HAVE YOU OR ANY FAMILY MEMBER TRAVELLED PAST 14 DAYS OUT OF THE   COUNTY---no STATE----no COUNTRY----no  HAVE YOU OR ANY FAMILY MEMBER BEEN EXPOSED TO ANYONE WITH COVID 19? no

## 2018-10-06 ENCOUNTER — Encounter (HOSPITAL_COMMUNITY): Payer: Self-pay | Admitting: *Deleted

## 2018-10-06 ENCOUNTER — Encounter (HOSPITAL_COMMUNITY)
Admission: RE | Disposition: A | Discharge status: Home / Self Care | Payer: Self-pay | Source: Other Acute Inpatient Hospital | Attending: Urology

## 2018-10-06 ENCOUNTER — Other Ambulatory Visit: Payer: Self-pay

## 2018-10-06 ENCOUNTER — Ambulatory Visit (HOSPITAL_COMMUNITY)
Admission: RE | Admit: 2018-10-06 | Discharge: 2018-10-06 | Disposition: A | Discharge status: Home / Self Care | Payer: Medicare Other | Source: Other Acute Inpatient Hospital | Attending: Urology | Admitting: Urology

## 2018-10-06 ENCOUNTER — Ambulatory Visit (HOSPITAL_COMMUNITY): Payer: Medicare Other

## 2018-10-06 DIAGNOSIS — Z79899 Other long term (current) drug therapy: Secondary | ICD-10-CM | POA: Insufficient documentation

## 2018-10-06 DIAGNOSIS — Z7982 Long term (current) use of aspirin: Secondary | ICD-10-CM | POA: Diagnosis not present

## 2018-10-06 DIAGNOSIS — Z87442 Personal history of urinary calculi: Secondary | ICD-10-CM | POA: Insufficient documentation

## 2018-10-06 DIAGNOSIS — N132 Hydronephrosis with renal and ureteral calculous obstruction: Secondary | ICD-10-CM | POA: Diagnosis not present

## 2018-10-06 DIAGNOSIS — I10 Essential (primary) hypertension: Secondary | ICD-10-CM | POA: Insufficient documentation

## 2018-10-06 DIAGNOSIS — Z1159 Encounter for screening for other viral diseases: Secondary | ICD-10-CM | POA: Insufficient documentation

## 2018-10-06 DIAGNOSIS — N201 Calculus of ureter: Secondary | ICD-10-CM

## 2018-10-06 DIAGNOSIS — Z955 Presence of coronary angioplasty implant and graft: Secondary | ICD-10-CM | POA: Diagnosis not present

## 2018-10-06 DIAGNOSIS — I252 Old myocardial infarction: Secondary | ICD-10-CM | POA: Diagnosis not present

## 2018-10-06 DIAGNOSIS — K449 Diaphragmatic hernia without obstruction or gangrene: Secondary | ICD-10-CM | POA: Diagnosis not present

## 2018-10-06 DIAGNOSIS — Z87891 Personal history of nicotine dependence: Secondary | ICD-10-CM | POA: Insufficient documentation

## 2018-10-06 DIAGNOSIS — I700 Atherosclerosis of aorta: Secondary | ICD-10-CM | POA: Insufficient documentation

## 2018-10-06 DIAGNOSIS — I2510 Atherosclerotic heart disease of native coronary artery without angina pectoris: Secondary | ICD-10-CM | POA: Insufficient documentation

## 2018-10-06 DIAGNOSIS — I4891 Unspecified atrial fibrillation: Secondary | ICD-10-CM | POA: Diagnosis not present

## 2018-10-06 DIAGNOSIS — Z7902 Long term (current) use of antithrombotics/antiplatelets: Secondary | ICD-10-CM | POA: Insufficient documentation

## 2018-10-06 DIAGNOSIS — K4090 Unilateral inguinal hernia, without obstruction or gangrene, not specified as recurrent: Secondary | ICD-10-CM | POA: Insufficient documentation

## 2018-10-06 DIAGNOSIS — M8580 Other specified disorders of bone density and structure, unspecified site: Secondary | ICD-10-CM | POA: Insufficient documentation

## 2018-10-06 HISTORY — DX: Cardiac arrhythmia, unspecified: I49.9

## 2018-10-06 HISTORY — DX: Dyspnea, unspecified: R06.00

## 2018-10-06 HISTORY — PX: EXTRACORPOREAL SHOCK WAVE LITHOTRIPSY: SHX1557

## 2018-10-06 HISTORY — DX: Personal history of urinary calculi: Z87.442

## 2018-10-06 SURGERY — LITHOTRIPSY, ESWL
Anesthesia: LOCAL | Laterality: Right

## 2018-10-06 MED ORDER — SODIUM CHLORIDE 0.9 % IV SOLN
INTRAVENOUS | Status: DC
  Administered 2018-10-06: 10:00:00 via INTRAVENOUS

## 2018-10-06 MED ORDER — TAMSULOSIN HCL 0.4 MG PO CAPS: 0.4000 mg | ORAL_CAPSULE | ORAL | 0 refills | Status: AC

## 2018-10-06 MED ORDER — CIPROFLOXACIN HCL 500 MG PO TABS
500.0000 mg | ORAL_TABLET | ORAL | Status: AC
  Administered 2018-10-06: 500 mg via ORAL
  Filled 2018-10-06: qty 1

## 2018-10-06 MED ORDER — DIPHENHYDRAMINE HCL 25 MG PO CAPS
25.0000 mg | ORAL_CAPSULE | ORAL | Status: AC
  Administered 2018-10-06: 25 mg via ORAL
  Filled 2018-10-06: qty 1

## 2018-10-06 MED ORDER — DIAZEPAM 5 MG PO TABS
10.0000 mg | ORAL_TABLET | ORAL | Status: AC
  Administered 2018-10-06: 10 mg via ORAL
  Filled 2018-10-06: qty 2

## 2018-10-06 MED ORDER — OXYCODONE HCL 10 MG PO TABS: 10.0000 mg | ORAL_TABLET | ORAL | 0 refills | Status: AC | PRN

## 2018-10-06 NOTE — Op Note (Signed)
See [facility] OP note scanned into chart. Also because of the size, density, location and other factors that cannot be anticipated I feel this will likely be a staged procedure. This fact supersedes any indication in the scanned [facility] operative note to the contrary.  

## 2018-10-06 NOTE — Interval H&P Note (Signed)
History and Physical Interval Note:  10/06/2018 9:35 AM  Mathew Bennett  has presented today for surgery, with the diagnosis of RIGHT URETERAL CALCULUS.  The various methods of treatment have been discussed with the patient and family. After consideration of risks, benefits and other options for treatment, the patient has consented to  Procedure(s): EXTRACORPOREAL SHOCK WAVE LITHOTRIPSY (ESWL) (Right) as a surgical intervention.  The patient's history has been reviewed, patient examined, no change in status, stable for surgery.  I have reviewed the patient's chart and labs.  Questions were answered to the patient's satisfaction.     Garnett Farm

## 2018-10-07 ENCOUNTER — Encounter (HOSPITAL_COMMUNITY): Payer: Self-pay | Admitting: Urology

## 2018-10-11 ENCOUNTER — Inpatient Hospital Stay (HOSPITAL_COMMUNITY): Admit: 2018-10-11 | Payer: Medicare Other

## 2018-10-11 ENCOUNTER — Telehealth: Payer: Self-pay | Admitting: Cardiology

## 2018-10-11 NOTE — Telephone Encounter (Signed)
Wife wants appt. cancelled, Patient isn't doing well after surgery,admin staff was informed.

## 2018-10-17 ENCOUNTER — Telehealth: Payer: Medicare Other | Admitting: Cardiology

## 2018-11-08 ENCOUNTER — Ambulatory Visit: Payer: Self-pay | Admitting: Neurology

## 2021-01-14 IMAGING — CT CT CERVICAL SPINE WITHOUT CONTRAST
5 of 8 series · 11 of 33 positions shown, 12 images · non-contrast
Comparison: None.

CLINICAL DATA: Acute pain due to trauma

EXAM:
CT HEAD WITHOUT CONTRAST
CT CERVICAL SPINE WITHOUT CONTRAST
TECHNIQUE: Multidetector CT imaging of the head and cervical spine was
performed following the standard protocol without intravenous
contrast. Multiplanar CT image reconstructions of the cervical spine
were also generated.

[Series 4: head bone · axial · 0.44mm/px · z∈[-102,-40]mm · 2 of 93 slices shown]
[im 31/93  bone]
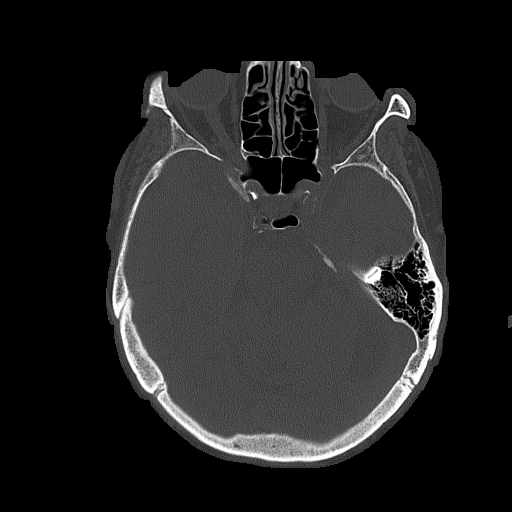
[im 62/93  bone]
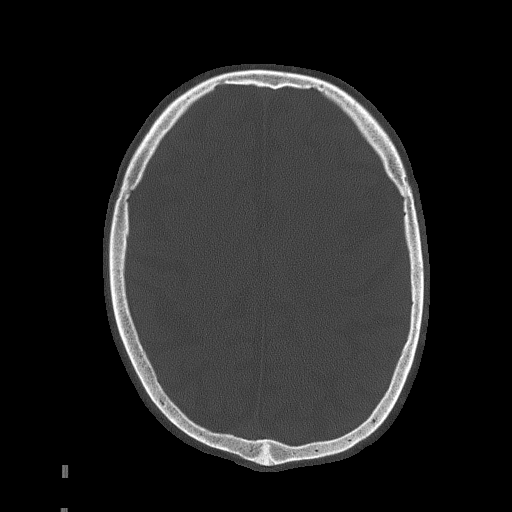

[Series 7: c_spine 2.0 st · axial · 0.45mm/px · z∈[-255,-193]mm · 2 of 93 slices shown, 3 images]
[im 31/93  soft-tissue]
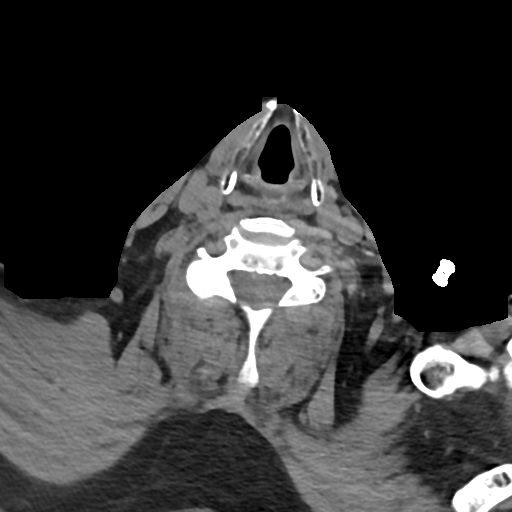
[im 31/93  bone]
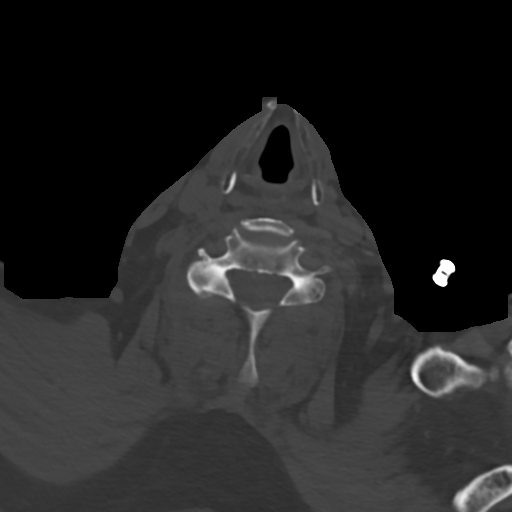
[im 62/93  bone]
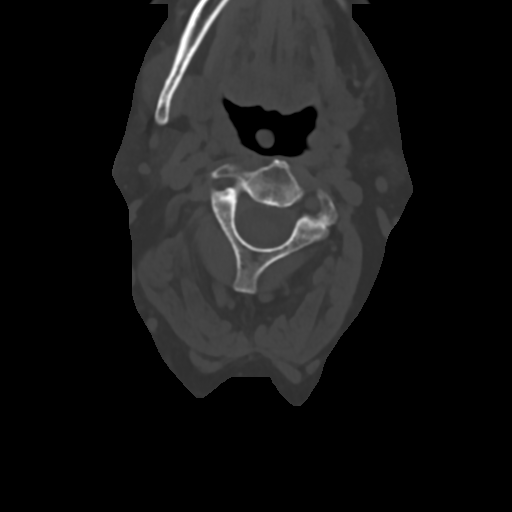

[Series 9: c_spine 2.0 sag bone · sagittal · 0.28mm/px · 4 of 61 slices shown]
[im 13/61  bone]
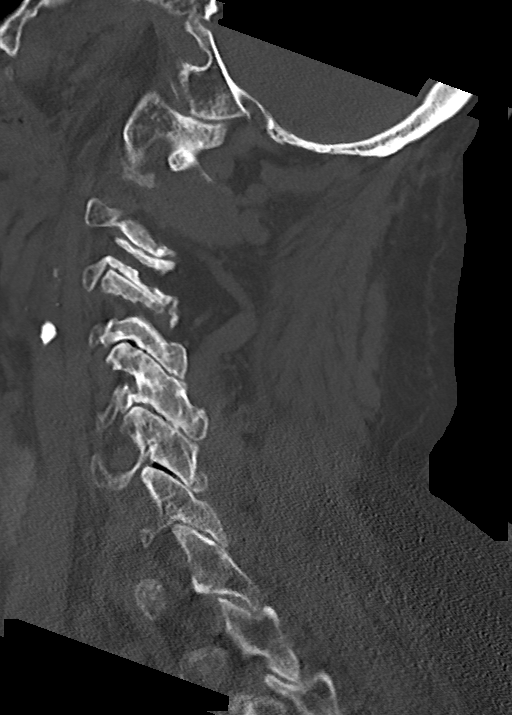
[im 25/61  bone]
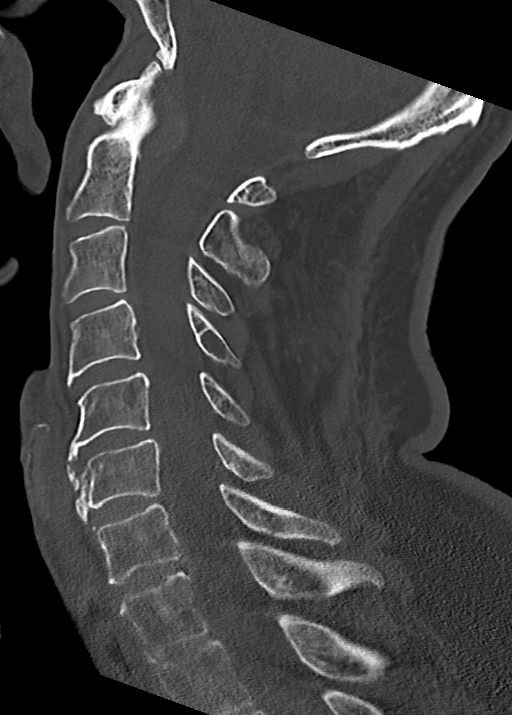
[im 37/61  bone]
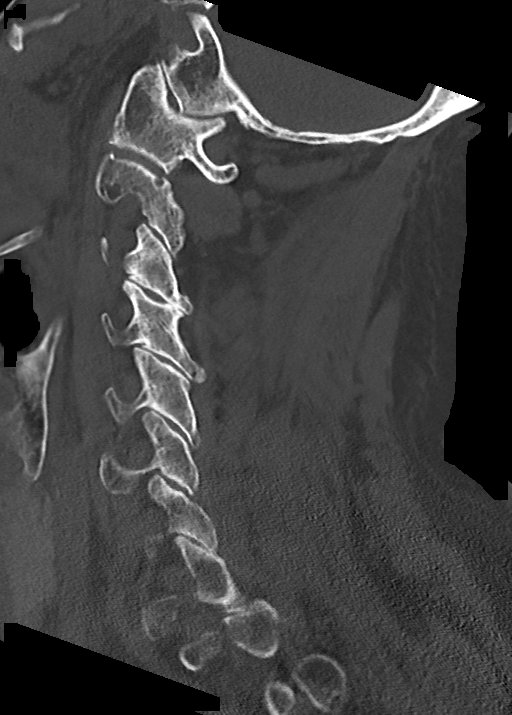
[im 49/61  bone]
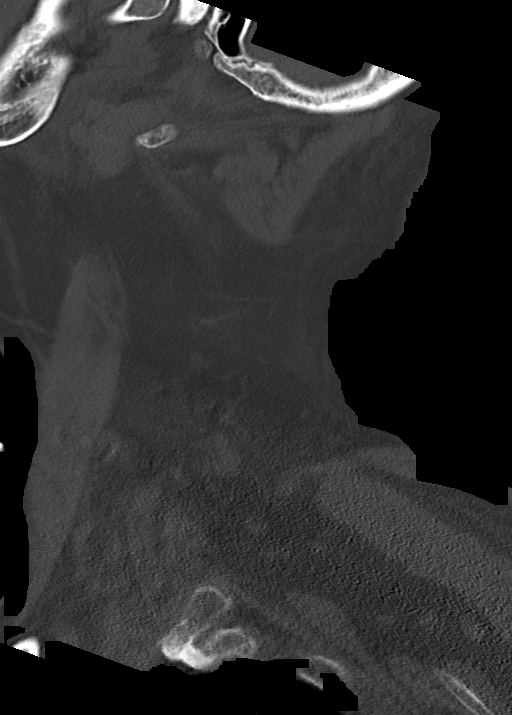

[Series 10: c_spine 2.0 cor bone · coronal · 0.27mm/px · 1 of 61 slices shown]
[im 31/61  bone]
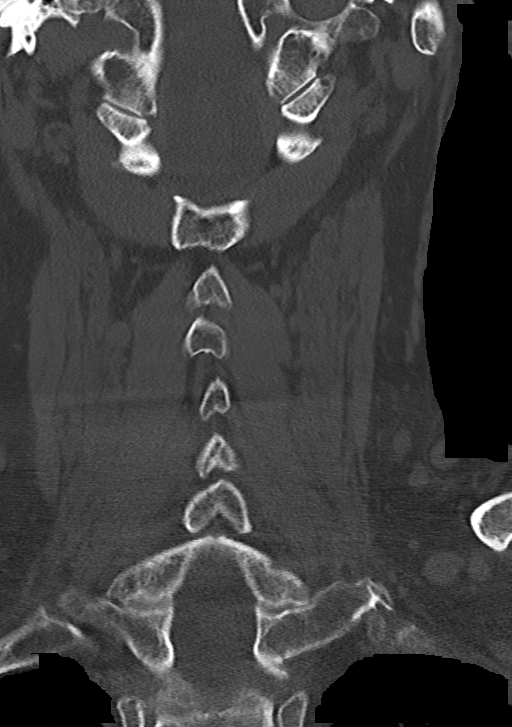

[Series 12: c_spine 2.0 orthogonals · axial · 0.21mm/px · z∈[-280,-214]mm · 2 of 96 slices shown]
[im 32/96  bone]
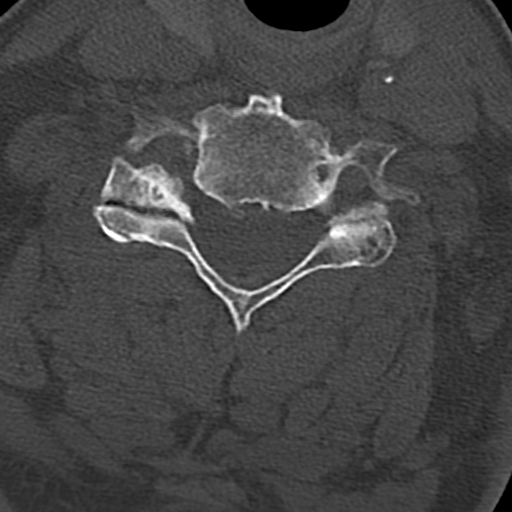
[im 64/96  bone]
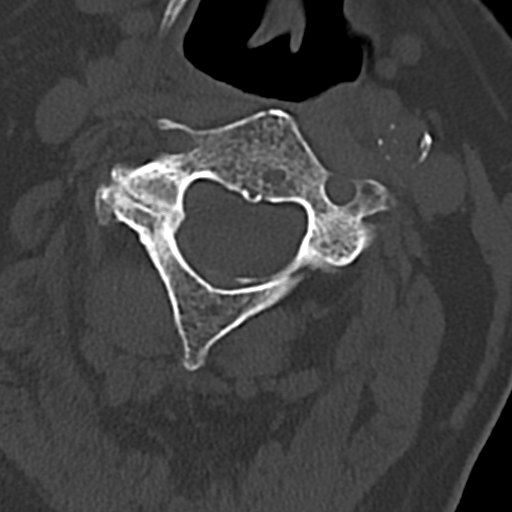

[11 of 33 positions shown; findings below may reference images not displayed]

FINDINGS: CT HEAD FINDINGS

Brain: No evidence of acute infarction, hemorrhage, hydrocephalus,
extra-axial collection or mass lesion/mass effect. Chronic
microvascular ischemic changes are noted. There is significant
volume loss, somewhat greater than expected for the patient's age.

Vascular: No hyperdense vessel or unexpected calcification.

Skull: Normal. Negative for fracture or focal lesion.

Sinuses/Orbits: No acute finding.

Other: None.

CT CERVICAL SPINE FINDINGS

Alignment: Normal.

Skull base and vertebrae: No acute fracture. No primary bone lesion
or focal pathologic process.

Soft tissues and spinal canal: No prevertebral fluid or swelling. No
visible canal hematoma.

Disc levels: There is mild to moderate multilevel disc height loss
throughout the cervical spine.

Upper chest: Negative.

Other: None
IMPRESSION: 1. No acute intracranial abnormality detected.
2. No acute cervical spine fracture.
3. Chronic microvascular ischemic changes are noted.
4. Mild multilevel degenerative changes are seen involving the
cervical spine.

## 2021-01-30 IMAGING — CR ABDOMEN - 1 VIEW
2 series · 2 of 2 positions shown · non-contrast
Comparison: 09/29/2018

CLINICAL DATA: Right sided lithotripsy today.

EXAM:
ABDOMEN - 1 VIEW

[t abdomen supine (1 of 2)]
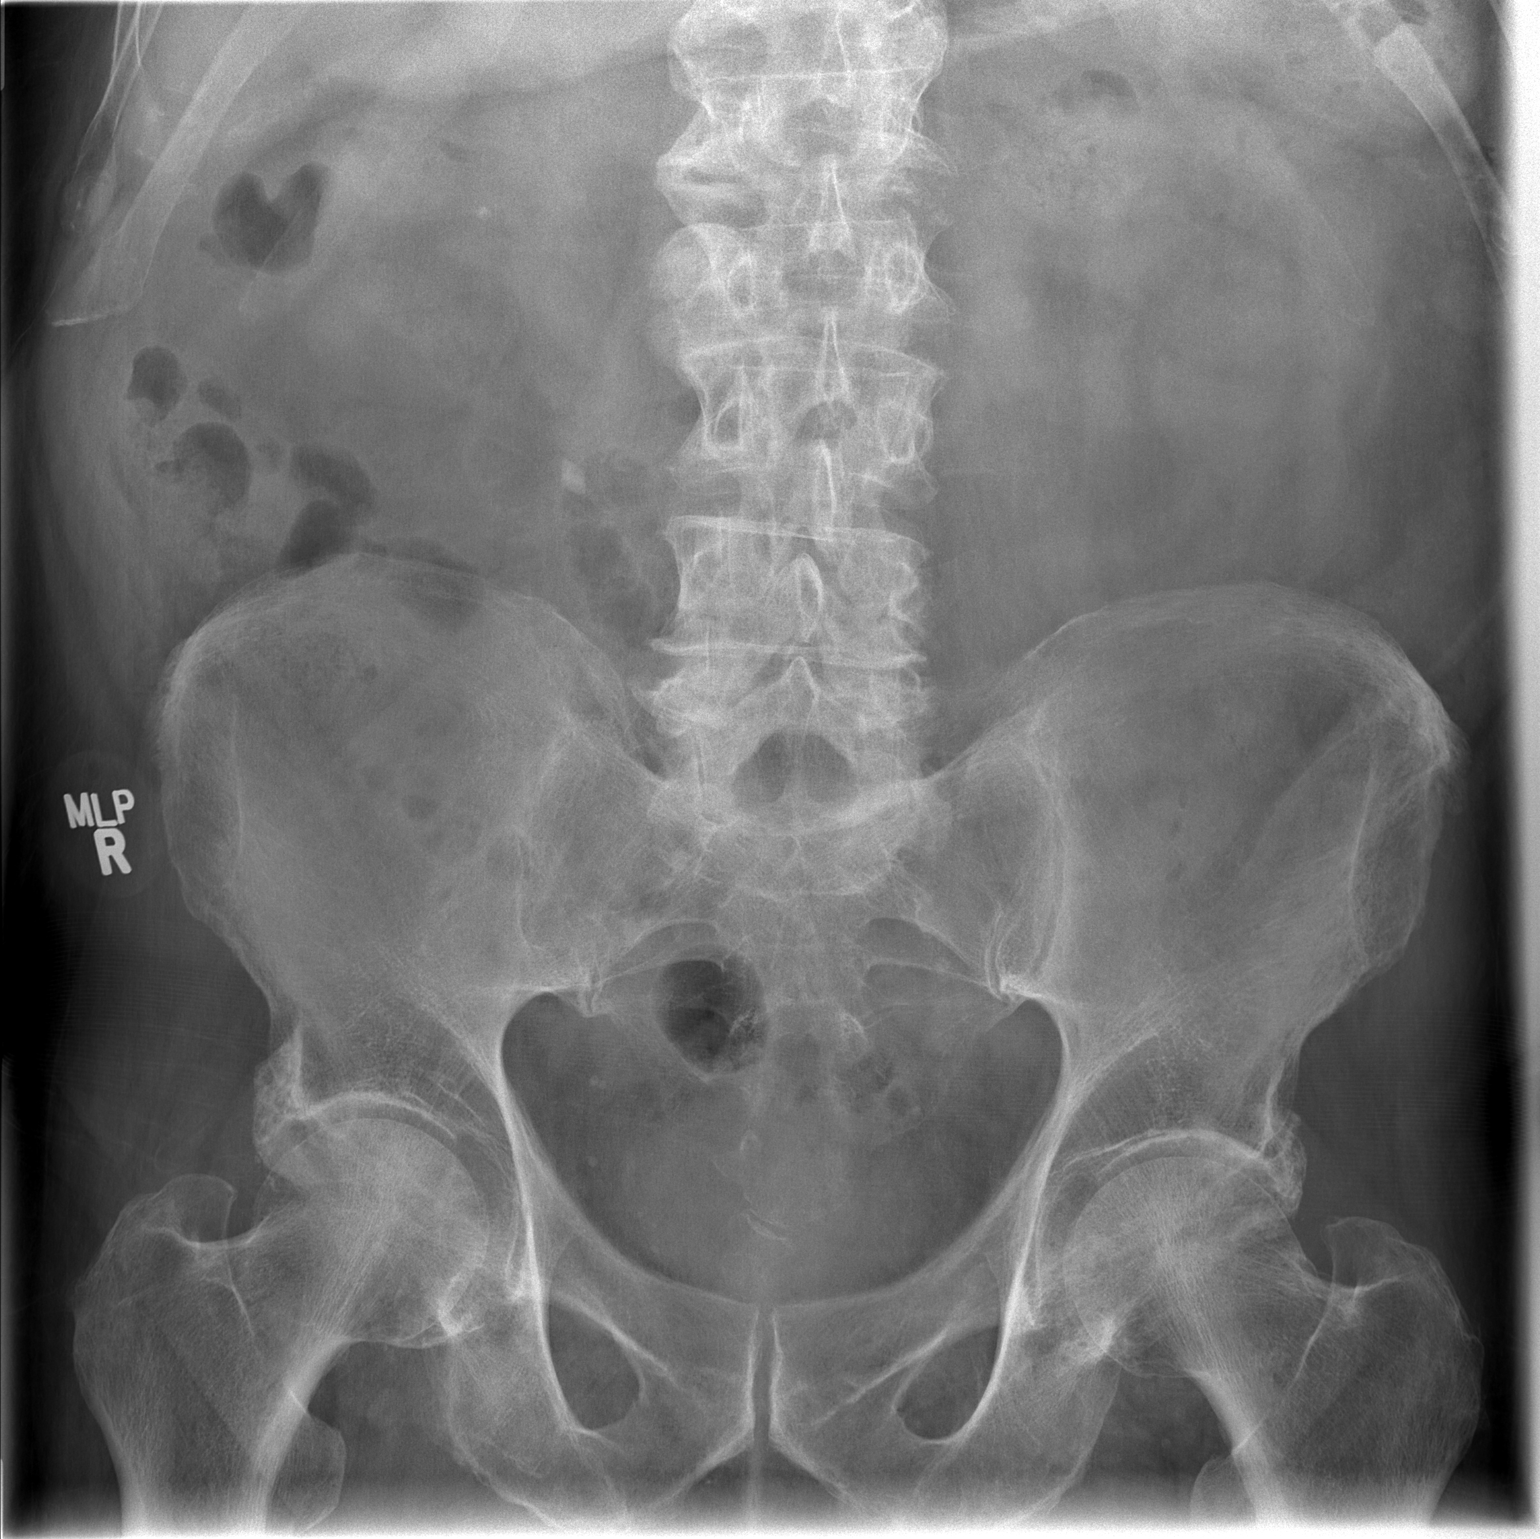

[t abdomen supine (2 of 2)]
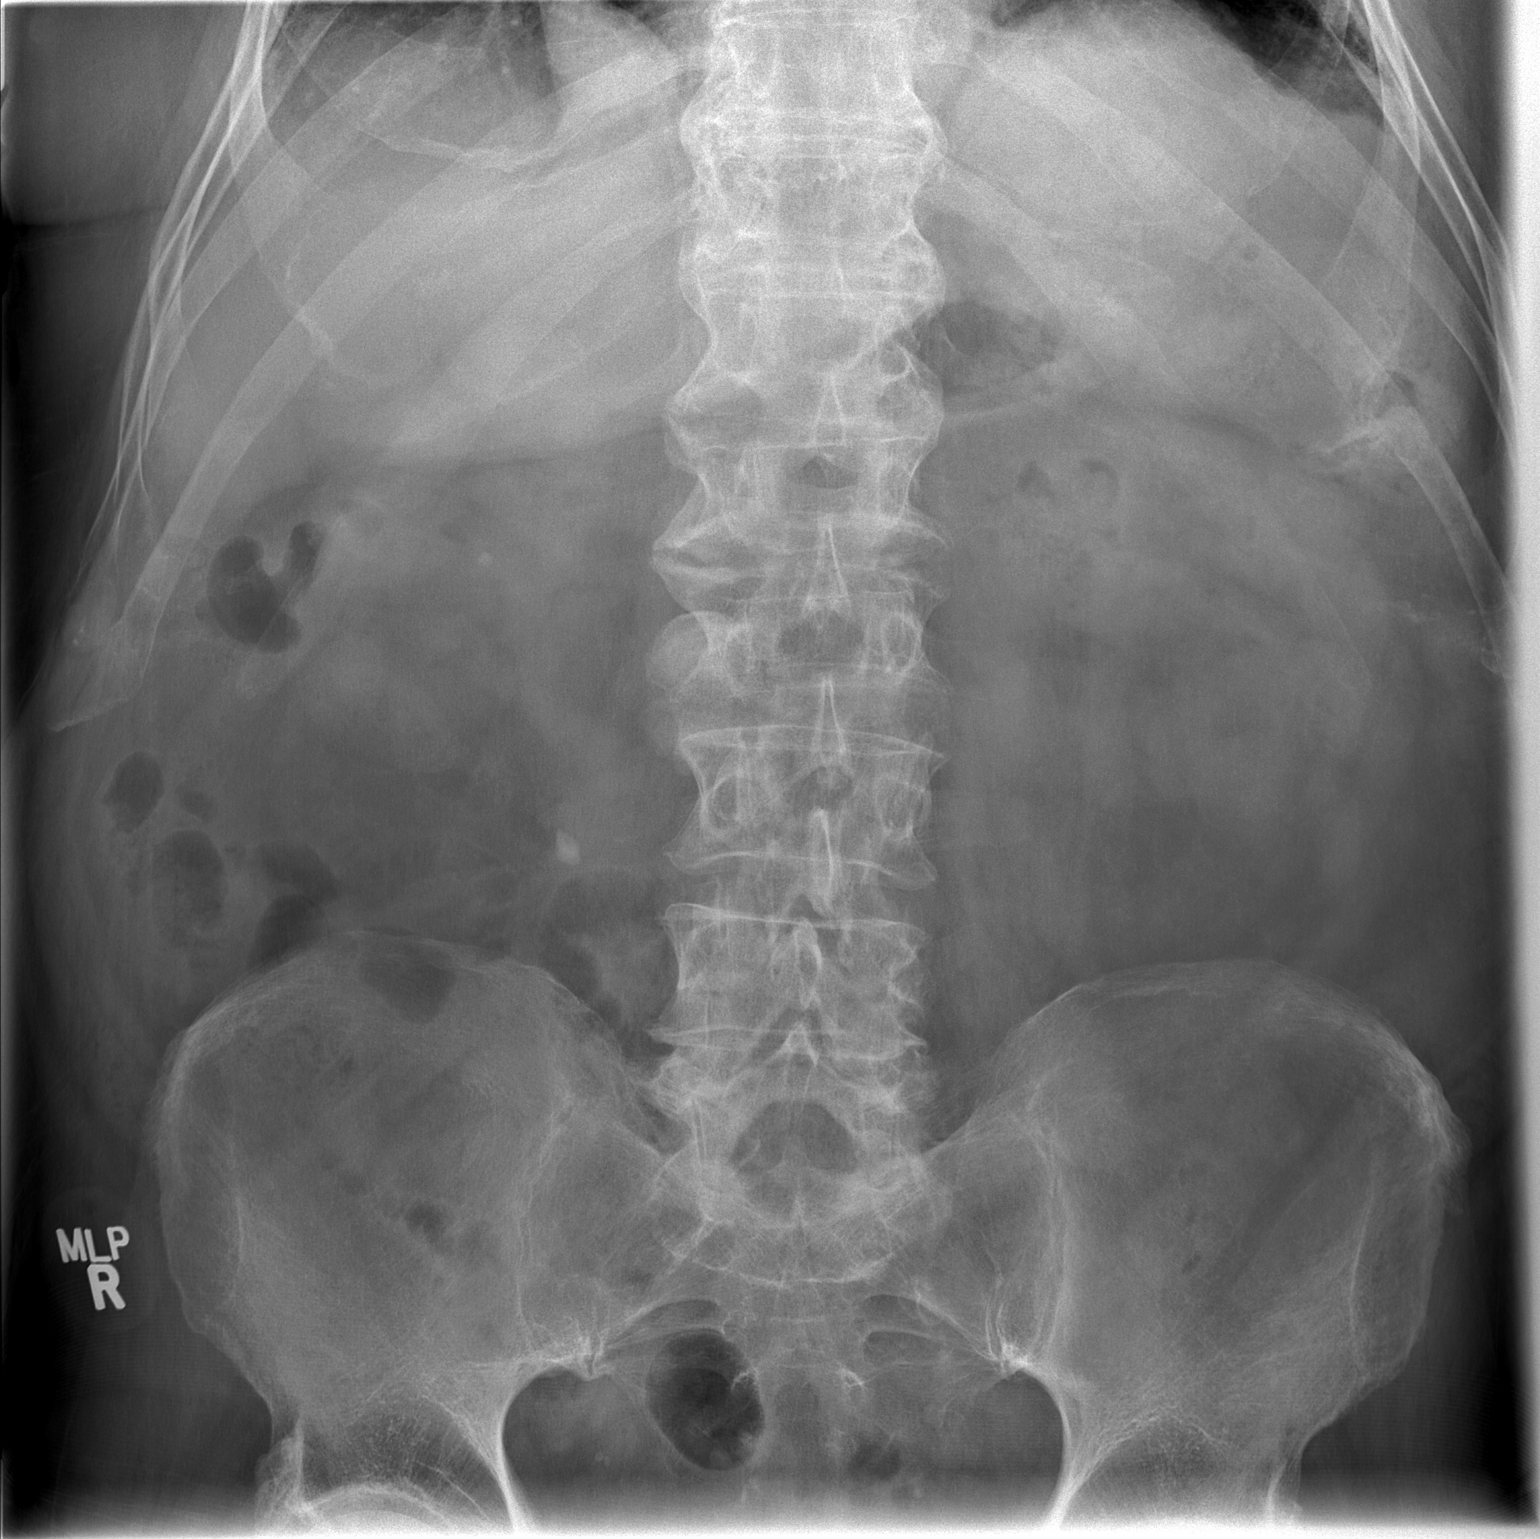

[2 of 2 positions shown; findings below may reference images not displayed]

FINDINGS: 11 mm stone remains evident on the right at the level of the L3-4
disc space. 4 mm stone remains evident within the right kidney.
Phleboliths in the pelvis. No stones seen on the left.
IMPRESSION: 11 mm stone in the right ureter at the level of L3-4, unchanged.

## 2021-09-03 DIAGNOSIS — I340 Nonrheumatic mitral (valve) insufficiency: Secondary | ICD-10-CM | POA: Diagnosis not present

## 2021-09-03 DIAGNOSIS — I361 Nonrheumatic tricuspid (valve) insufficiency: Secondary | ICD-10-CM | POA: Diagnosis not present

## 2021-10-02 DEATH — deceased
